# Patient Record
Sex: Female | Born: 1980 | Race: Black or African American | Hispanic: No | Marital: Married | Smoking: Never smoker
Health system: Southern US, Community
[De-identification: ages and names within clinical notes are randomized; demographics above are authoritative.]

## PROBLEM LIST (undated history)

## (undated) DIAGNOSIS — N926 Irregular menstruation, unspecified: Secondary | ICD-10-CM

## (undated) DIAGNOSIS — S83289A Other tear of lateral meniscus, current injury, unspecified knee, initial encounter: Secondary | ICD-10-CM

## (undated) DIAGNOSIS — M94261 Chondromalacia, right knee: Secondary | ICD-10-CM

## (undated) DIAGNOSIS — M1711 Unilateral primary osteoarthritis, right knee: Secondary | ICD-10-CM

## (undated) HISTORY — PX: INCISION AND DRAINAGE BREAST ABSCESS: SUR672

---
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
## 2001-05-09 ENCOUNTER — Emergency Department (HOSPITAL_COMMUNITY): Admission: EM | Admit: 2001-05-09 | Discharge: 2001-05-09 | Payer: Self-pay | Admitting: Emergency Medicine

## 2001-10-13 ENCOUNTER — Emergency Department (HOSPITAL_COMMUNITY): Admission: EM | Admit: 2001-10-13 | Discharge: 2001-10-13 | Payer: Self-pay

## 2002-01-27 ENCOUNTER — Emergency Department (HOSPITAL_COMMUNITY): Admission: EM | Admit: 2002-01-27 | Discharge: 2002-01-27 | Payer: Self-pay | Admitting: *Deleted

## 2002-02-08 ENCOUNTER — Encounter: Payer: Self-pay | Admitting: Emergency Medicine

## 2002-02-08 ENCOUNTER — Emergency Department (HOSPITAL_COMMUNITY): Admission: EM | Admit: 2002-02-08 | Discharge: 2002-02-08 | Payer: Self-pay | Admitting: Emergency Medicine

## 2002-03-14 ENCOUNTER — Emergency Department (HOSPITAL_COMMUNITY): Admission: EM | Admit: 2002-03-14 | Discharge: 2002-03-14 | Payer: Self-pay | Admitting: Emergency Medicine

## 2002-03-17 ENCOUNTER — Emergency Department (HOSPITAL_COMMUNITY): Admission: EM | Admit: 2002-03-17 | Discharge: 2002-03-17 | Payer: Self-pay | Admitting: Emergency Medicine

## 2002-07-01 ENCOUNTER — Emergency Department (HOSPITAL_COMMUNITY): Admission: EM | Admit: 2002-07-01 | Discharge: 2002-07-02 | Payer: Self-pay | Admitting: Emergency Medicine

## 2002-11-04 ENCOUNTER — Inpatient Hospital Stay (HOSPITAL_COMMUNITY): Admission: AD | Admit: 2002-11-04 | Discharge: 2002-11-04 | Payer: Self-pay | Admitting: *Deleted

## 2003-01-04 ENCOUNTER — Inpatient Hospital Stay (HOSPITAL_COMMUNITY): Admission: AD | Admit: 2003-01-04 | Discharge: 2003-01-04 | Payer: Self-pay | Admitting: *Deleted

## 2003-01-19 ENCOUNTER — Inpatient Hospital Stay (HOSPITAL_COMMUNITY): Admission: AD | Admit: 2003-01-19 | Discharge: 2003-01-19 | Payer: Self-pay | Admitting: *Deleted

## 2003-01-19 ENCOUNTER — Encounter: Payer: Self-pay | Admitting: *Deleted

## 2003-01-28 ENCOUNTER — Encounter: Payer: Self-pay | Admitting: Emergency Medicine

## 2003-01-28 ENCOUNTER — Emergency Department (HOSPITAL_COMMUNITY): Admission: EM | Admit: 2003-01-28 | Discharge: 2003-01-28 | Payer: Self-pay | Admitting: Emergency Medicine

## 2003-02-02 ENCOUNTER — Emergency Department (HOSPITAL_COMMUNITY): Admission: EM | Admit: 2003-02-02 | Discharge: 2003-02-02 | Payer: Self-pay | Admitting: Emergency Medicine

## 2004-04-24 ENCOUNTER — Inpatient Hospital Stay (HOSPITAL_COMMUNITY): Admission: AD | Admit: 2004-04-24 | Discharge: 2004-04-24 | Payer: Self-pay | Admitting: Obstetrics and Gynecology

## 2004-07-22 ENCOUNTER — Emergency Department (HOSPITAL_COMMUNITY): Admission: EM | Admit: 2004-07-22 | Discharge: 2004-07-22 | Payer: Self-pay | Admitting: Emergency Medicine

## 2004-08-13 ENCOUNTER — Inpatient Hospital Stay (HOSPITAL_COMMUNITY): Admission: AD | Admit: 2004-08-13 | Discharge: 2004-08-13 | Payer: Self-pay | Admitting: *Deleted

## 2004-09-08 ENCOUNTER — Emergency Department (HOSPITAL_COMMUNITY): Admission: EM | Admit: 2004-09-08 | Discharge: 2004-09-09 | Payer: Self-pay | Admitting: Emergency Medicine

## 2004-10-23 ENCOUNTER — Emergency Department (HOSPITAL_COMMUNITY): Admission: EM | Admit: 2004-10-23 | Discharge: 2004-10-23 | Payer: Self-pay | Admitting: Emergency Medicine

## 2004-11-02 ENCOUNTER — Inpatient Hospital Stay (HOSPITAL_COMMUNITY): Admission: AD | Admit: 2004-11-02 | Discharge: 2004-11-03 | Payer: Self-pay | Admitting: *Deleted

## 2004-12-18 ENCOUNTER — Emergency Department (HOSPITAL_COMMUNITY): Admission: EM | Admit: 2004-12-18 | Discharge: 2004-12-18 | Payer: Self-pay | Admitting: Emergency Medicine

## 2005-02-28 ENCOUNTER — Emergency Department (HOSPITAL_COMMUNITY): Admission: EM | Admit: 2005-02-28 | Discharge: 2005-02-28 | Payer: Self-pay | Admitting: Emergency Medicine

## 2005-04-12 ENCOUNTER — Inpatient Hospital Stay (HOSPITAL_COMMUNITY): Admission: AD | Admit: 2005-04-12 | Discharge: 2005-04-12 | Payer: Self-pay | Admitting: Obstetrics & Gynecology

## 2005-05-01 ENCOUNTER — Emergency Department (HOSPITAL_COMMUNITY): Admission: EM | Admit: 2005-05-01 | Discharge: 2005-05-02 | Payer: Self-pay | Admitting: *Deleted

## 2005-06-04 ENCOUNTER — Inpatient Hospital Stay (HOSPITAL_COMMUNITY): Admission: AD | Admit: 2005-06-04 | Discharge: 2005-06-04 | Payer: Self-pay | Admitting: Obstetrics and Gynecology

## 2006-01-12 ENCOUNTER — Inpatient Hospital Stay (HOSPITAL_COMMUNITY): Admission: AD | Admit: 2006-01-12 | Discharge: 2006-01-13 | Payer: Self-pay | Admitting: *Deleted

## 2006-01-13 ENCOUNTER — Inpatient Hospital Stay (HOSPITAL_COMMUNITY): Admission: AD | Admit: 2006-01-13 | Discharge: 2006-01-13 | Payer: Self-pay | Admitting: Family Medicine

## 2006-01-15 ENCOUNTER — Inpatient Hospital Stay (HOSPITAL_COMMUNITY): Admission: AD | Admit: 2006-01-15 | Discharge: 2006-01-15 | Payer: Self-pay | Admitting: Family Medicine

## 2006-01-18 ENCOUNTER — Inpatient Hospital Stay (HOSPITAL_COMMUNITY): Admission: AD | Admit: 2006-01-18 | Discharge: 2006-01-19 | Payer: Self-pay | Admitting: Obstetrics and Gynecology

## 2006-01-24 ENCOUNTER — Inpatient Hospital Stay (HOSPITAL_COMMUNITY): Admission: AD | Admit: 2006-01-24 | Discharge: 2006-01-25 | Payer: Self-pay | Admitting: Obstetrics and Gynecology

## 2006-02-10 ENCOUNTER — Emergency Department (HOSPITAL_COMMUNITY): Admission: EM | Admit: 2006-02-10 | Discharge: 2006-02-10 | Payer: Self-pay | Admitting: Emergency Medicine

## 2006-10-26 ENCOUNTER — Emergency Department (HOSPITAL_COMMUNITY): Admission: EM | Admit: 2006-10-26 | Discharge: 2006-10-26 | Payer: Self-pay | Admitting: Emergency Medicine

## 2006-11-29 ENCOUNTER — Inpatient Hospital Stay (HOSPITAL_COMMUNITY): Admission: AD | Admit: 2006-11-29 | Discharge: 2006-11-29 | Payer: Self-pay | Admitting: Obstetrics and Gynecology

## 2006-12-02 ENCOUNTER — Inpatient Hospital Stay (HOSPITAL_COMMUNITY): Admission: AD | Admit: 2006-12-02 | Discharge: 2006-12-02 | Payer: Self-pay | Admitting: Obstetrics & Gynecology

## 2007-04-01 ENCOUNTER — Emergency Department (HOSPITAL_COMMUNITY): Admission: EM | Admit: 2007-04-01 | Discharge: 2007-04-01 | Payer: Self-pay | Admitting: Emergency Medicine

## 2007-04-17 ENCOUNTER — Inpatient Hospital Stay (HOSPITAL_COMMUNITY): Admission: AD | Admit: 2007-04-17 | Discharge: 2007-04-17 | Payer: Self-pay | Admitting: Obstetrics and Gynecology

## 2007-04-21 ENCOUNTER — Inpatient Hospital Stay (HOSPITAL_COMMUNITY): Admission: AD | Admit: 2007-04-21 | Discharge: 2007-04-21 | Payer: Self-pay | Admitting: Obstetrics and Gynecology

## 2007-04-30 ENCOUNTER — Inpatient Hospital Stay (HOSPITAL_COMMUNITY): Admission: AD | Admit: 2007-04-30 | Discharge: 2007-05-06 | Payer: Self-pay | Admitting: Obstetrics & Gynecology

## 2007-05-03 ENCOUNTER — Ambulatory Visit: Payer: Self-pay | Admitting: Surgery

## 2008-02-14 ENCOUNTER — Inpatient Hospital Stay (HOSPITAL_COMMUNITY): Admission: AD | Admit: 2008-02-14 | Discharge: 2008-02-14 | Payer: Self-pay | Admitting: Gynecology

## 2008-02-21 ENCOUNTER — Emergency Department (HOSPITAL_COMMUNITY): Admission: EM | Admit: 2008-02-21 | Discharge: 2008-02-21 | Payer: Self-pay | Admitting: Emergency Medicine

## 2008-06-10 ENCOUNTER — Inpatient Hospital Stay (HOSPITAL_COMMUNITY): Admission: AD | Admit: 2008-06-10 | Discharge: 2008-06-10 | Payer: Self-pay | Admitting: Family Medicine

## 2009-01-01 IMAGING — CR DG CHEST 2V
2 series · 2 of 2 positions shown · non-contrast
Comparison: None.

CLINICAL DATA: Chest pain. Shortness of breath.

CHEST - 2 VIEW  04/01/2007:

[w chest pa]
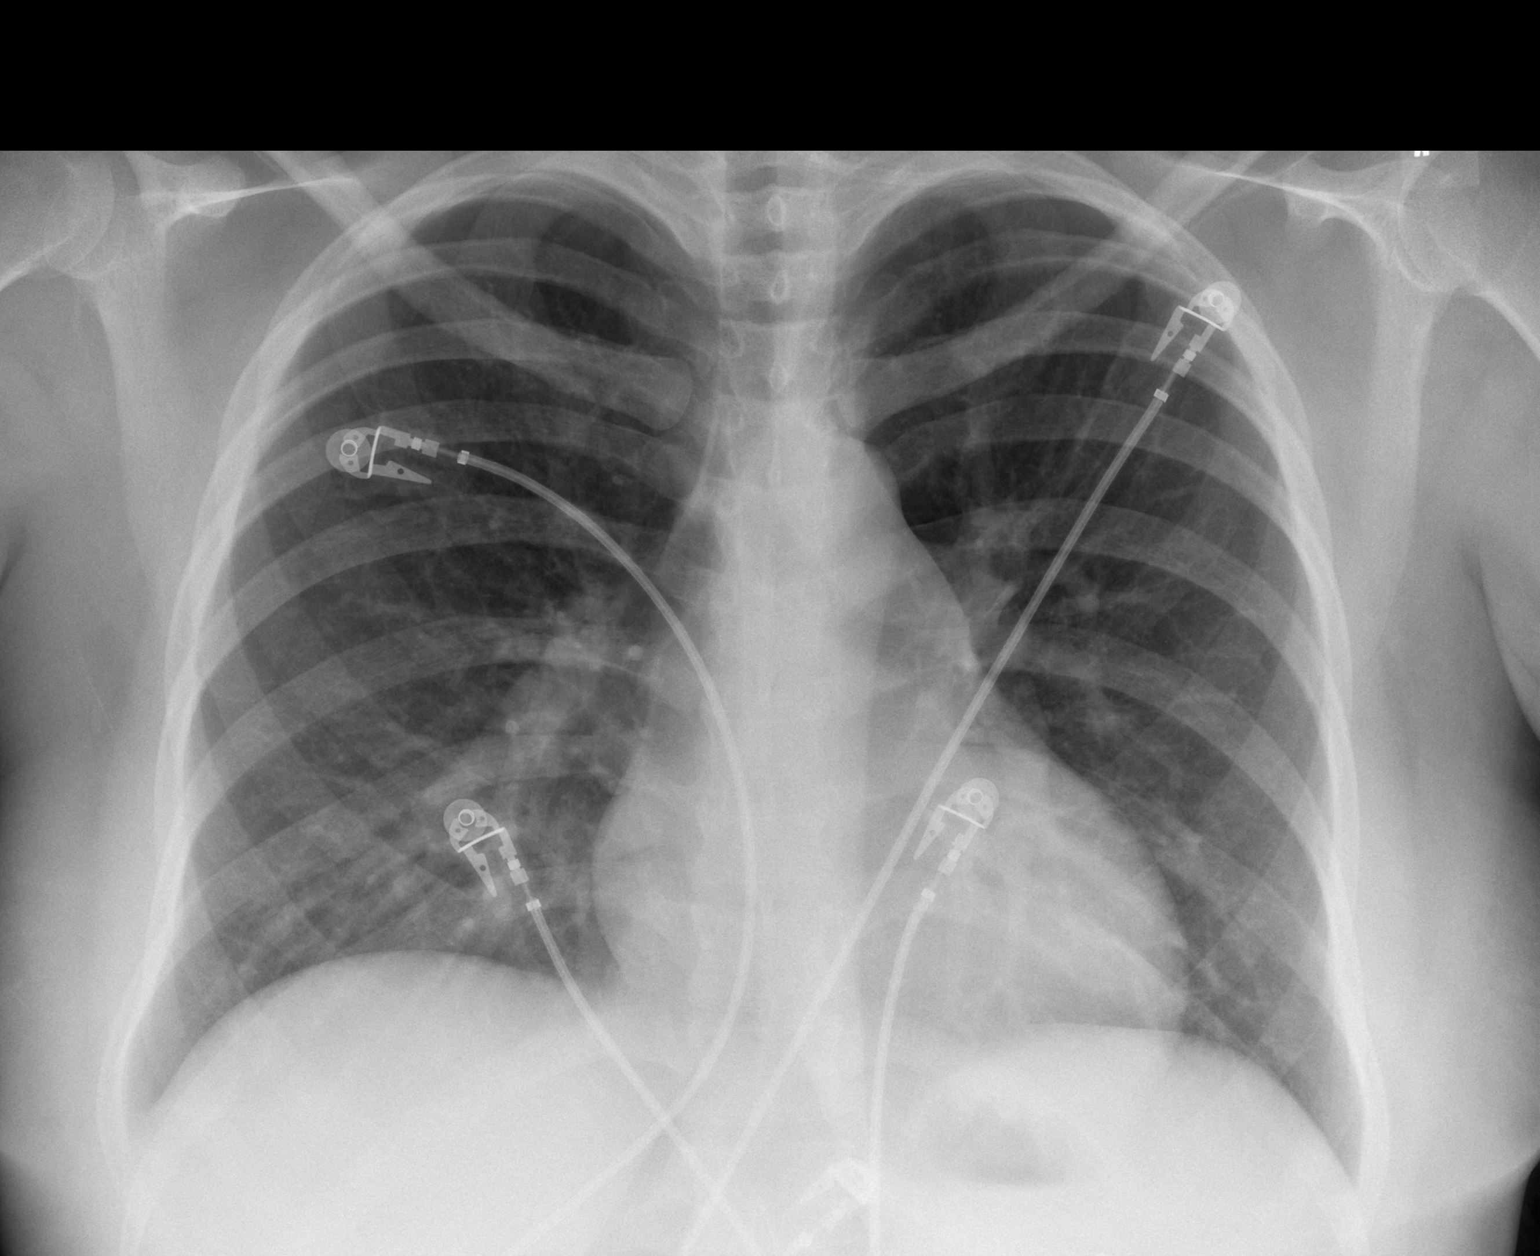

[w chest lat]
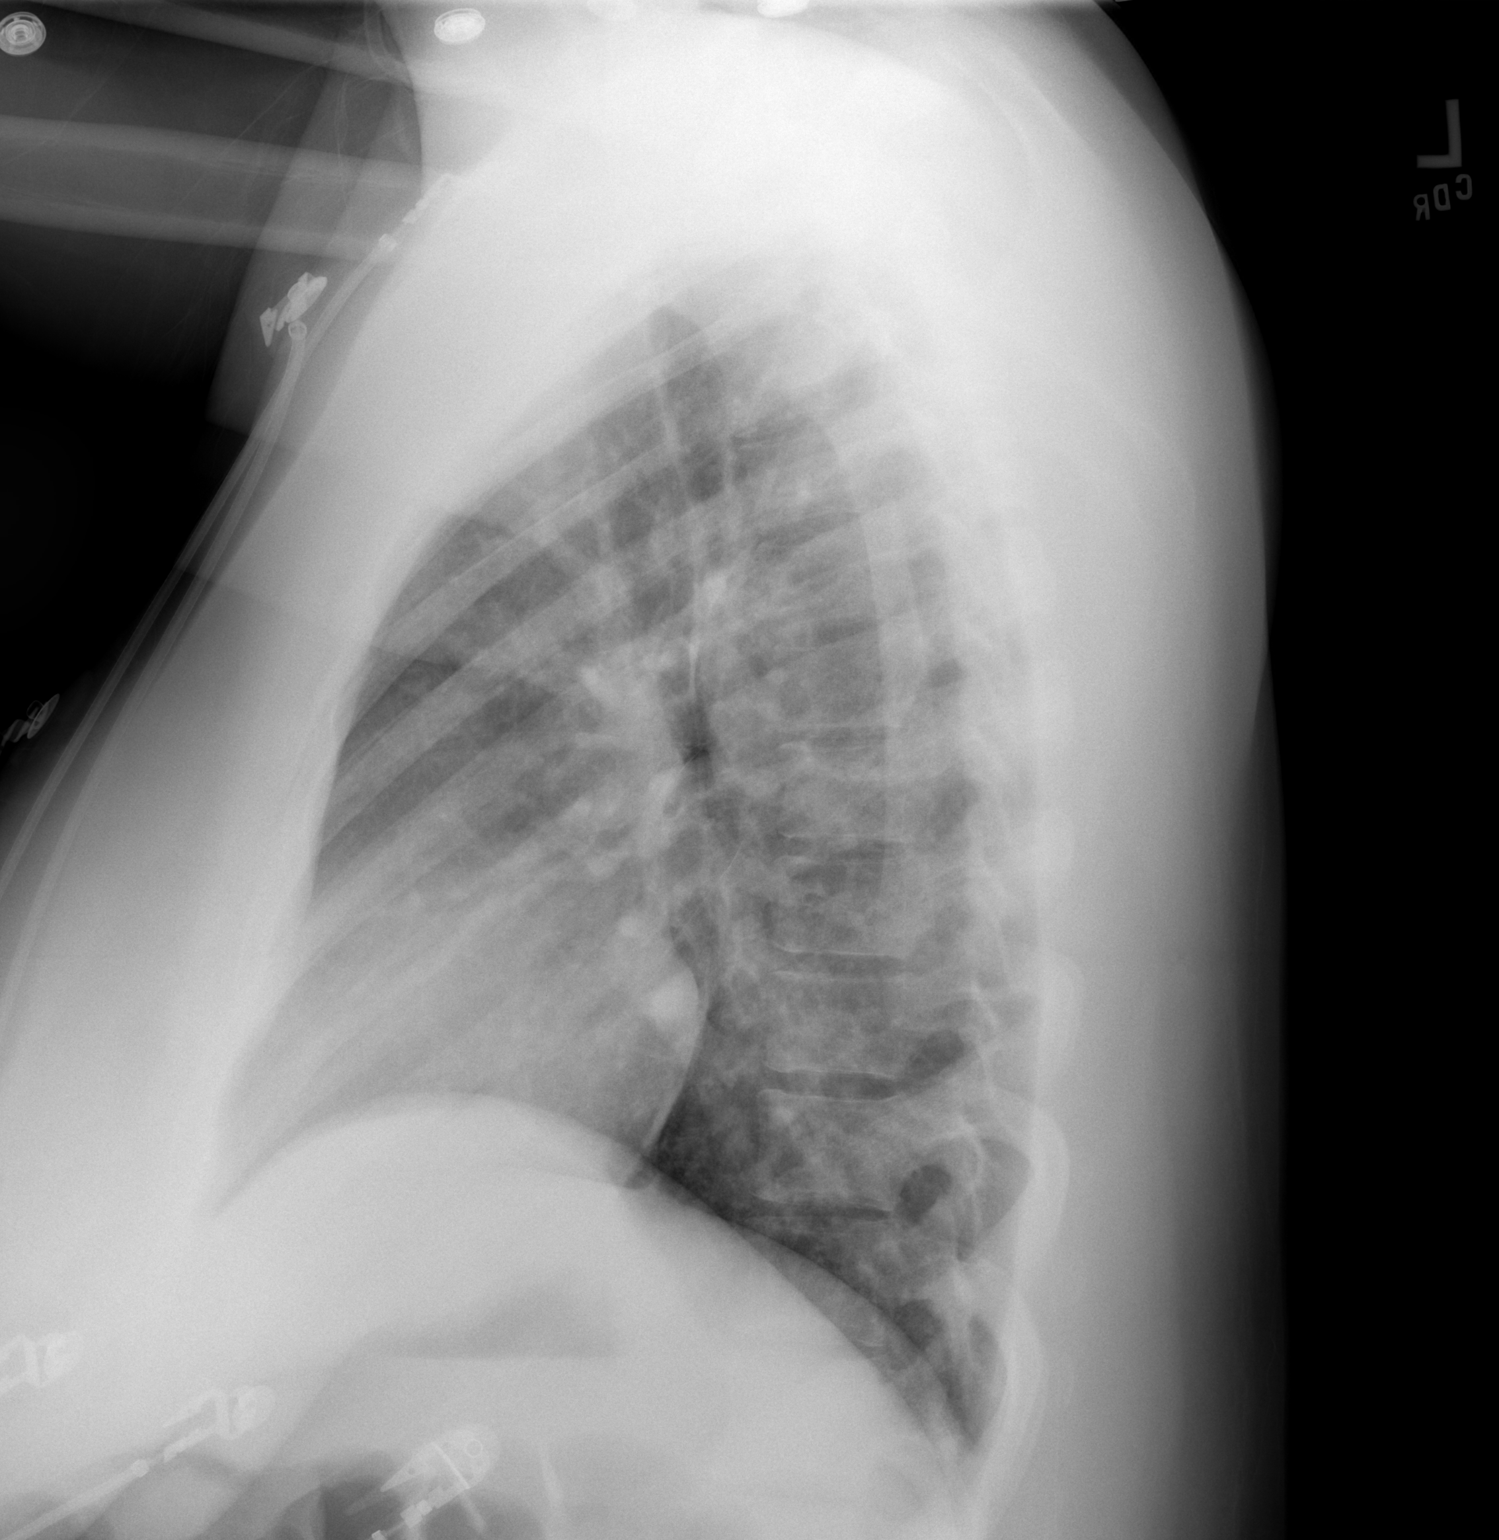

[2 of 2 positions shown; findings below may reference images not displayed]

FINDINGS: Cardiomediastinal silhouette unremarkable. Inspiration less than
optimal on the frontal projection accounting for the crowded bronchovascular
markings in the bases. Inspiration improved on the lateral projection and the
lungs are clear. No pleural effusions. Visualized bony thorax intact.
IMPRESSION: No acute cardiopulmonary disease.

## 2009-02-06 ENCOUNTER — Inpatient Hospital Stay (HOSPITAL_COMMUNITY): Admission: AD | Admit: 2009-02-06 | Discharge: 2009-02-06 | Payer: Self-pay | Admitting: Obstetrics & Gynecology

## 2009-02-08 ENCOUNTER — Ambulatory Visit: Payer: Self-pay | Admitting: Obstetrics and Gynecology

## 2009-02-08 ENCOUNTER — Inpatient Hospital Stay (HOSPITAL_COMMUNITY): Admission: AD | Admit: 2009-02-08 | Discharge: 2009-02-08 | Payer: Self-pay | Admitting: Obstetrics and Gynecology

## 2009-03-22 ENCOUNTER — Emergency Department (HOSPITAL_COMMUNITY): Admission: EM | Admit: 2009-03-22 | Discharge: 2009-03-22 | Payer: Self-pay | Admitting: Emergency Medicine

## 2009-09-08 HISTORY — PX: BREAST EXCISIONAL BIOPSY: SUR124

## 2009-11-20 ENCOUNTER — Inpatient Hospital Stay (HOSPITAL_COMMUNITY): Admission: AD | Admit: 2009-11-20 | Discharge: 2009-11-20 | Payer: Self-pay | Admitting: *Deleted

## 2009-12-20 ENCOUNTER — Emergency Department (HOSPITAL_COMMUNITY): Admission: EM | Admit: 2009-12-20 | Discharge: 2009-12-20 | Payer: Self-pay | Admitting: Emergency Medicine

## 2010-01-28 ENCOUNTER — Emergency Department (HOSPITAL_COMMUNITY): Admission: EM | Admit: 2010-01-28 | Discharge: 2010-01-29 | Payer: Self-pay | Admitting: Emergency Medicine

## 2010-11-25 LAB — POCT I-STAT, CHEM 8
BUN: 10 mg/dL (ref 6–23)
Calcium, Ion: 1.05 mmol/L — ABNORMAL LOW (ref 1.12–1.32)
Chloride: 108 mEq/L (ref 96–112)
Creatinine, Ser: 0.9 mg/dL (ref 0.4–1.2)
Glucose, Bld: 87 mg/dL (ref 70–99)
HCT: 39 % (ref 36.0–46.0)
Hemoglobin: 13.3 g/dL (ref 12.0–15.0)
Potassium: 4 mEq/L (ref 3.5–5.1)
Sodium: 137 mEq/L (ref 135–145)
TCO2: 21 mmol/L (ref 0–100)

## 2010-11-25 LAB — URINALYSIS, ROUTINE W REFLEX MICROSCOPIC
Bilirubin Urine: NEGATIVE
Glucose, UA: NEGATIVE mg/dL
Hgb urine dipstick: NEGATIVE
Ketones, ur: NEGATIVE mg/dL
Nitrite: NEGATIVE
Protein, ur: NEGATIVE mg/dL
Specific Gravity, Urine: 1.025 (ref 1.005–1.030)
Urobilinogen, UA: 0.2 mg/dL (ref 0.0–1.0)
pH: 5.5 (ref 5.0–8.0)

## 2010-12-16 LAB — URINALYSIS, ROUTINE W REFLEX MICROSCOPIC
Bilirubin Urine: NEGATIVE
Glucose, UA: NEGATIVE mg/dL
Ketones, ur: NEGATIVE mg/dL
Leukocytes, UA: NEGATIVE
Nitrite: NEGATIVE
Protein, ur: NEGATIVE mg/dL
Specific Gravity, Urine: 1.02 (ref 1.005–1.030)
Urobilinogen, UA: 0.2 mg/dL (ref 0.0–1.0)
pH: 5.5 (ref 5.0–8.0)

## 2010-12-16 LAB — CBC
HCT: 36.8 % (ref 36.0–46.0)
Hemoglobin: 12.6 g/dL (ref 12.0–15.0)
RBC: 4.11 MIL/uL (ref 3.87–5.11)

## 2010-12-16 LAB — URINE MICROSCOPIC-ADD ON

## 2010-12-16 LAB — GC/CHLAMYDIA PROBE AMP, GENITAL
Chlamydia, DNA Probe: NEGATIVE
GC Probe Amp, Genital: POSITIVE — AB

## 2011-01-15 ENCOUNTER — Inpatient Hospital Stay (INDEPENDENT_AMBULATORY_CARE_PROVIDER_SITE_OTHER)
Admission: RE | Admit: 2011-01-15 | Discharge: 2011-01-15 | Disposition: A | Discharge status: Home / Self Care | Payer: PRIVATE HEALTH INSURANCE | Source: Ambulatory Visit | Attending: Family Medicine | Admitting: Family Medicine

## 2011-01-15 DIAGNOSIS — N61 Mastitis without abscess: Secondary | ICD-10-CM

## 2011-01-21 NOTE — Op Note (Signed)
NAME:  Susan Hamilton, Susan Hamilton NO.:  [id number]   MEDICAL RECORD NO.:  [id number]          PATIENT TYPE:  INP   LOCATION:  9114                          FACILITY:  [facility]   PHYSICIAN:  Gerrit Friends. Aldona Bar, M.D.   DATE OF BIRTH:  [birth year]   DATE OF PROCEDURE:  05/01/2007  DATE OF DISCHARGE:                               OPERATIVE REPORT   PREOPERATIVE DIAGNOSIS:  38-1/2 week intrauterine pregnancy with  maternal requested cesarean section.   POSTOPERATIVE DIAGNOSIS:  38-1/2 week intrauterine pregnancy with  maternal requested cesarean section, delivery of 5 pounds 5 ounces  female infant, Apgars 08/09.   PROCEDURE:  Primary low transverse cesarean section.   SURGEON:  Gerrit Friends. Aldona Bar, M.D.   ANESTHESIA:  Spinal.   HISTORY:  This 30 year old gravida 2, para 0 was admitted on the evening  of 08/22, having been brought to maternity admissions by ambulance  because of a questionable fall at home shortly prior to arrival in  maternity admissions unit.  The patient was experiencing some left leg  pain and lower abdominal pressure on arrival.  Her blood pressure was in  the 130s/80s to 90s and there was some 1 to 2+ edema of the lower  extremities but otherwise her evaluation was negative for any signs of  preeclampsia and fetal heart rate was reactive.  Her cervix was  posterior closed and the vertex was high.  Both the patient and her  mother were extremely anxious and very desirous of admission and  delivery.  Induction was offered but after explaining induction to the  patient in the mother they opted for a primary low transverse cesarean  section which is now being carried out.  The patient was admitted for  overnight observation.  She was monitored and on the morning of 08/23  shortly prior to the scheduled time for cesarean section the procedure  was again reviewed with the patient in the presence of the nurse and her  mother.  Again the patient expresses proper strong  desire for a maternal  requested cesarean section for delivery.  She understands the increased  maternal risk for a cesarean section as opposed to a vaginal delivery  and essentially gave informed consent for this procedure.   The patient was taken to the operating room where after satisfactory  induction of spinal anesthetic she was placed in supine position  slightly tilted left.  She was prepped and draped in usual fashion with  a Foley catheter inserted.  After she was appropriately draped and good  anesthetic levels were documented procedure was begun.   A Pfannenstiel incision was made with minimal difficulty dissected down  sharply to and through the fascia low transverse fashion with hemostasis  created each layer.  Subfascial space was created inferiorly and  superiorly, peritoneum identified and entered appropriately with care  taken to avoid the bowel superiorly and bladder inferiorly.  The bladder  was noted to be somewhat high.   Once the peritoneum was opened, the bladder blade was placed and the  vesicouterine peritoneum was incised in low transverse fashion and  pushed off the  lower uterine segment with ease.  Sharp incision into the  uterus in low transverse fashion was carried out and extended laterally  with fingers.  Thereafter amniotomy produced with production of clear  fluid and thereafter delivery of viable female infant was carried out  from vertex position.  There was a body cord times one.  Infant cried  spontaneously at once and after cord was clamped and cut the infant was  passed off to the waiting team.  Infant was ultimately taken to nursery  in good condition.  Weight wa found to be 5 pounds 5 ounces and Apgars  were 08/09.   As the patient was a cord blood donor, placenta was delivered intact and  passed off to the waiting cord blood personnel.  Cord bloods were be  collected and sent by them.   At this time the uterus was exteriorized.  There was a  small seedling  myoma on the anterior surface of the uterus but otherwise the uterus was  essentially normal as were the tubes and ovaries.  After the uterus  rendered free of any remaining products conception and good uterine  contractility was afforded with slowly given intravenous Pitocin and  manual stimulation.  Closure of the uterine incision was carried out  using a single layer of #1 Vicryl in a running locking fashion and this  was oversewn with several figure-of-eight #1 Vicryls for additional  hemostasis.   At this time the uterine incision dry.  The vesicouterine peritoneum was  reapproximated using running 3-0 Vicryl.  Thereafter, after the abdomen  lavaged of all free blood and clot, the uterus was replaced into the  abdominal cavity and after all counts noted to be correct and no foreign  bodies noted to be remaining in the abdominal cavity closure of the  abdomen was carried out in layers.  The abdominal peritoneum was closed  with 0 Vicryl running fashion.  Muscle secured with same.  Assured of  good fascial hemostasis the fascia was then reapproximated using 0  Vicryl from angle to midline bilaterally.  Subcutaneous tissues were  hemostatic and staples were used to close skin.  Sterile pressure  dressing was applied.  Sterile pressure dressing was applied and at this  time the patient was transport to recovery in satisfactory having  tolerated the procedure well.  Estimated blood loss 500 mL.  All counts  correct x2.  At the conclusion of procedure, both mother and baby were  doing well in their respective recovery areas.   In summary, this patient requested a cesarean section for delivery after  being present the option of induction.  Informed consent was given and  procedure was carried out according to the patient's expectations.      Gerrit Friends. Aldona Bar, M.D.  Electronically Signed     RMW/MEDQ  D:  05/01/2007  T:  05/02/2007  Job:  [id number]

## 2011-01-24 NOTE — Discharge Summary (Signed)
NAME:  Susan Hamilton, Susan Hamilton NO.:  [id number]   MEDICAL RECORD NO.:  [id number]          PATIENT TYPE:  INP   LOCATION:  9374                          FACILITY:  [facility]   PHYSICIAN:  Randye Lobo, M.D.   DATE OF BIRTH:  February 19, 1981   DATE OF ADMISSION:  04/30/2007  DATE OF DISCHARGE:  05/06/2007                               DISCHARGE SUMMARY   FINAL DIAGNOSES:  Intrauterine pregnancy at 38-1/2 weeks' gestation.  Maternal request of a cesarean section.  Declines vaginal delivery.  Postpartum pre-eclampsia.   PROCEDURE:  Primary low transverse cesarean section.  Surgeon Dr. Annamaria Helling.  Complications none.   This 30 year old, G2, P0-0-1-0 presents to the [facility] on the  evening of August 22.  She was brought to maternity admissions by  ambulance because of a questionable fall at home.  The patient was  experiencing left leg pain and lower abdominal pain and pressure on  arrival.  Blood pressure was stable.  She did have some lower extremity  edema.  The fetal heart rate was reactive.  Cervix was still closed and  posterior.  The patient was very anxious.  Dr. Aldona Bar held a discussion  with the patient and her mother about induction, but they both opted for  a primary cesarean section.  The patient's antepartum course up to this  point otherwise had been uncomplicated.  She otherwise had a negative  group B strep culture obtained in our office around 35 weeks.  The  patient was admitted for overnight observation and on the morning of the  23rd was taken for her cesarean section which she still desired.  The  patient was taken to the operating room on May 01, 2007 by Dr. Annamaria Helling where a primary low transverse cesarean section was performed with  the delivery of a 5-pound 5-ounces female infant with Apgars of 8 and 9.  Delivery went without complications.  The patient's postoperative course  was without any significant fevers.  The patient did still continue  to  have some left lower extremity pain but negative Homan's sign.  She did  have a Doppler performed which came back negative for deep venous  thrombosis.  The patient was felt ready for discharge on postoperative  day #3 but then started to complain of some headaches and spots and  visual changes.  The patient started to have some abnormal liver  function tests.  At this point, a decision was, of course, made to keep  the patient in the hospital and start 24 hours of postpartum mag sulfate  for seizure prophylaxis.  She was transferred to the ICU on  postoperative day #3 for this.  She was started on labetalol 300 mg  every 8 hours for her blood pressure.  There is a long note in the  patient's chart from nursing about taking her medications; she would not  take her medications.  By postoperative day #5, the patient was desiring  discharge.  Her blood pressures were stable.  She was having good urine  output, and her liver function tests were normalizing.  The  magnesium  sulfate was discontinued.  She was observed until later in the day and  felt ready for discharge.  She was sent home on her vitamins, was given  Vicodin 1 to 2 every 4 to 6 hours as needed for the pain, told she could  also use ibuprofen up to 600 mg every 6 hours as needed for her pain.  Instructions and precautions were reviewed with the patient.  She was to  follow up in our office in 5-6 days for blood pressure recheck.   The patient's final labs on discharge:  A hemoglobin of 11.9, Cerreta  blood cell count of 9.2, platelets of 220,000 and normalizing liver  function tests on discharge.  She had an AST at 55 and an ALT of 90,  uric acid of 7.4.      Leilani Able, P.A.-C.      Randye Lobo, M.D.  Electronically Signed    MB/MEDQ  D:  06/16/2007  T:  06/17/2007  Job:  [id number]

## 2011-04-18 ENCOUNTER — Inpatient Hospital Stay (INDEPENDENT_AMBULATORY_CARE_PROVIDER_SITE_OTHER)
Admission: RE | Admit: 2011-04-18 | Discharge: 2011-04-18 | Disposition: A | Discharge status: Home / Self Care | Payer: Medicaid Other | Source: Ambulatory Visit | Attending: Family Medicine | Admitting: Family Medicine

## 2011-04-18 DIAGNOSIS — N61 Mastitis without abscess: Secondary | ICD-10-CM

## 2011-04-22 LAB — CULTURE, ROUTINE-ABSCESS

## 2011-05-30 ENCOUNTER — Inpatient Hospital Stay (INDEPENDENT_AMBULATORY_CARE_PROVIDER_SITE_OTHER)
Admission: RE | Admit: 2011-05-30 | Discharge: 2011-05-30 | Disposition: A | Discharge status: Home / Self Care | Payer: Medicaid Other | Source: Ambulatory Visit | Attending: Emergency Medicine | Admitting: Emergency Medicine

## 2011-05-30 DIAGNOSIS — F410 Panic disorder [episodic paroxysmal anxiety] without agoraphobia: Secondary | ICD-10-CM

## 2011-06-05 LAB — URINALYSIS, ROUTINE W REFLEX MICROSCOPIC
Bilirubin Urine: NEGATIVE
Glucose, UA: NEGATIVE
Ketones, ur: NEGATIVE
Protein, ur: NEGATIVE
Urobilinogen, UA: 0.2

## 2011-06-05 LAB — CBC
HCT: 36.2
Hemoglobin: 12.7
MCV: 87.3
RBC: 4.14
WBC: 8.4

## 2011-06-05 LAB — POCT PREGNANCY, URINE: Preg Test, Ur: NEGATIVE

## 2011-06-05 LAB — URINE MICROSCOPIC-ADD ON

## 2011-06-09 LAB — CBC
HCT: 39.9
MCV: 89.6
Platelets: 227
RDW: 13.1

## 2011-06-09 LAB — URINALYSIS, ROUTINE W REFLEX MICROSCOPIC
Glucose, UA: NEGATIVE
Hgb urine dipstick: NEGATIVE
Ketones, ur: 15 — AB
pH: 6

## 2011-06-09 LAB — WET PREP, GENITAL
Clue Cells Wet Prep HPF POC: NONE SEEN
Trich, Wet Prep: NONE SEEN

## 2011-06-09 LAB — GC/CHLAMYDIA PROBE AMP, GENITAL: Chlamydia, DNA Probe: NEGATIVE

## 2011-06-20 LAB — COMPREHENSIVE METABOLIC PANEL
ALT: 107 — ABNORMAL HIGH
ALT: 23
ALT: 68 — ABNORMAL HIGH
ALT: 90 — ABNORMAL HIGH
AST: 23
AST: 55 — ABNORMAL HIGH
AST: 73 — ABNORMAL HIGH
Albumin: 2.1 — ABNORMAL LOW
Albumin: 2.4 — ABNORMAL LOW
Albumin: 2.5 — ABNORMAL LOW
Alkaline Phosphatase: 111
Alkaline Phosphatase: 166 — ABNORMAL HIGH
BUN: 3 — ABNORMAL LOW
BUN: 6
CO2: 25
CO2: 25
Calcium: 8.1 — ABNORMAL LOW
Calcium: 8.4
Calcium: 8.4
Calcium: 8.8
Creatinine, Ser: 0.7
GFR calc Af Amer: >60
GFR calc Af Amer: >60
GFR calc Af Amer: >60
GFR calc non Af Amer: >60
GFR calc non Af Amer: >60
GFR calc non Af Amer: >60
Glucose, Bld: 77
Glucose, Bld: 79
Potassium: 3.8
Sodium: 136
Sodium: 137
Sodium: 138
Sodium: 140
Total Bilirubin: 0.5
Total Protein: 4.9 — ABNORMAL LOW
Total Protein: 5.5 — ABNORMAL LOW
Total Protein: 5.9 — ABNORMAL LOW
Total Protein: 6.1

## 2011-06-20 LAB — URINALYSIS, ROUTINE W REFLEX MICROSCOPIC
Bilirubin Urine: NEGATIVE
Ketones, ur: NEGATIVE
Nitrite: NEGATIVE
Urobilinogen, UA: 0.2

## 2011-06-20 LAB — CBC
HCT: 33.4 — ABNORMAL LOW
HCT: 35.6 — ABNORMAL LOW
Hemoglobin: 11 — ABNORMAL LOW
Hemoglobin: 11.5 — ABNORMAL LOW
Hemoglobin: 12.4
MCHC: 34.5
MCHC: 34.7
MCHC: 35
Platelets: 165
Platelets: 177
RBC: 3.49 — ABNORMAL LOW
RBC: 3.68 — ABNORMAL LOW
RDW: 14
RDW: 14.2 — ABNORMAL HIGH
RDW: 14.2 — ABNORMAL HIGH
RDW: 14.4 — ABNORMAL HIGH
WBC: 10.7 — ABNORMAL HIGH
WBC: 9.9

## 2011-06-20 LAB — DIFFERENTIAL
Basophils Relative: 0
Eosinophils Absolute: 0
Eosinophils Absolute: 0.1
Eosinophils Absolute: 0.1
Eosinophils Absolute: 0.2
Eosinophils Relative: 1
Eosinophils Relative: 1
Eosinophils Relative: 2
Lymphocytes Relative: 28
Lymphs Abs: 2
Lymphs Abs: 2.4
Lymphs Abs: 2.5
Lymphs Abs: 2.8
Monocytes Absolute: 1 — ABNORMAL HIGH
Monocytes Absolute: 1.1 — ABNORMAL HIGH
Monocytes Relative: 11
Monocytes Relative: 11
Monocytes Relative: 12 — ABNORMAL HIGH
Neutro Abs: 5.2
Neutrophils Relative %: 60
Neutrophils Relative %: 63
Neutrophils Relative %: 66

## 2011-06-20 LAB — LACTATE DEHYDROGENASE
LDH: 197
LDH: 210

## 2011-06-20 LAB — MAGNESIUM
Magnesium: 4.1 — ABNORMAL HIGH
Magnesium: 4.9 — ABNORMAL HIGH

## 2011-06-20 LAB — URIC ACID
Uric Acid, Serum: 5.8
Uric Acid, Serum: 7

## 2011-06-20 LAB — CCBB MATERNAL DONOR DRAW

## 2011-06-20 LAB — RPR: RPR Ser Ql: NONREACTIVE

## 2011-06-23 LAB — COMPREHENSIVE METABOLIC PANEL
ALT: 19
AST: 24
Alkaline Phosphatase: 149 — ABNORMAL HIGH
CO2: 24
Chloride: 108
Creatinine, Ser: 0.6
GFR calc Af Amer: >60
GFR calc non Af Amer: >60
Sodium: 136
Total Bilirubin: 0.5

## 2011-06-23 LAB — CBC
MCV: 90.2
RBC: 4.34
WBC: 9.6

## 2011-06-23 LAB — URINALYSIS, ROUTINE W REFLEX MICROSCOPIC
Bilirubin Urine: NEGATIVE
Glucose, UA: NEGATIVE
Hgb urine dipstick: NEGATIVE
Hgb urine dipstick: NEGATIVE
Ketones, ur: NEGATIVE
Protein, ur: NEGATIVE
Specific Gravity, Urine: 1.02
Urobilinogen, UA: 0.2

## 2011-06-23 LAB — I-STAT 8, (EC8 V) (CONVERTED LAB)
Acid-base deficit: 3 — ABNORMAL HIGH
Bicarbonate: 22.1
Hemoglobin: 14.6
Potassium: 3.9
Sodium: 136
TCO2: 23

## 2011-06-23 LAB — POCT CARDIAC MARKERS
CKMB, poc: 1.1
Troponin i, poc: <0.05

## 2012-06-26 ENCOUNTER — Emergency Department (INDEPENDENT_AMBULATORY_CARE_PROVIDER_SITE_OTHER)
Admission: EM | Admit: 2012-06-26 | Discharge: 2012-06-26 | Disposition: A | Discharge status: Home / Self Care | Payer: Medicaid Other | Source: Home / Self Care | Attending: Family Medicine | Admitting: Family Medicine

## 2012-06-26 ENCOUNTER — Encounter (HOSPITAL_COMMUNITY): Payer: Self-pay | Admitting: Emergency Medicine

## 2012-06-26 DIAGNOSIS — W57XXXA Bitten or stung by nonvenomous insect and other nonvenomous arthropods, initial encounter: Secondary | ICD-10-CM

## 2012-06-26 DIAGNOSIS — T148 Other injury of unspecified body region: Secondary | ICD-10-CM

## 2012-06-26 MED ORDER — HYDROXYZINE HCL 25 MG PO TABS: 25.0000 mg | ORAL_TABLET | Freq: Four times a day (QID) | ORAL | Status: DC

## 2012-06-26 MED ORDER — METHYLPREDNISOLONE ACETATE 40 MG/ML IJ SUSP
80.0000 mg | Freq: Once | INTRAMUSCULAR | Status: AC
  Administered 2012-06-26: 80 mg via INTRAMUSCULAR

## 2012-06-26 MED ORDER — METHYLPREDNISOLONE ACETATE 80 MG/ML IJ SUSP
INTRAMUSCULAR | Status: AC
  Filled 2012-06-26: qty 1

## 2012-06-26 MED ORDER — FLUTICASONE PROPIONATE 0.05 % EX CREA: TOPICAL_CREAM | Freq: Two times a day (BID) | CUTANEOUS | Status: DC

## 2012-06-26 NOTE — ED Provider Notes (Signed)
History     CSN: [id number]  Arrival date & time 06/26/12  1221   First MD Initiated Contact with Patient 06/26/12 1239      Chief Complaint  Patient presents with  . Rash    (Consider location/radiation/quality/duration/timing/severity/associated sxs/prior treatment) Patient is a 31 y.o. female presenting with rash. The history is provided by the patient.  Rash  This is a new problem. The current episode started 2 days ago. The problem has not changed since onset.Associated with: hotel stay, then noticed bumps. There has been no fever. The rash is present on the face, left arm, right arm, right hand and left ankle. The patient is experiencing no pain. Associated symptoms include itching.    History reviewed. No pertinent past medical history.  History reviewed. No pertinent past surgical history.  History reviewed. No pertinent family history.  History  Substance Use Topics  . Smoking status: Never Smoker   . Smokeless tobacco: Not on file  . Alcohol Use: No    OB History    Grav Para Term Preterm Abortions TAB SAB Ect Mult Living                  Review of Systems  Constitutional: Negative.   Skin: Positive for itching and rash.    Allergies  Review of patient's allergies indicates no known allergies.  Home Medications   Current Outpatient Rx  Name Route Sig Dispense Refill  . FLUTICASONE PROPIONATE 0.05 % EX CREA Topical Apply topically 2 (two) times daily. 60 g 0  . HYDROXYZINE HCL 25 MG PO TABS Oral Take 1 tablet (25 mg total) by mouth every 6 (six) hours. For itching 20 tablet 0    BP 128/72  Pulse 78  Temp 98.2 F (36.8 C) (Oral)  Resp 18  SpO2 100%  LMP 06/19/2012  Physical Exam  Nursing note and vitals reviewed. Constitutional: She is oriented to person, place, and time. She appears well-developed and well-nourished.  Neurological: She is alert and oriented to person, place, and time.  Skin: Skin is warm and dry. Rash noted.   Scattered erythematous raised pruritic papular lesions, c/w insect bites.    ED Course  Procedures (including critical care time)  Labs Reviewed - No data to display No results found.   1. Multiple insect bites       MDM          Linna Hoff, MD 06/26/12 1356

## 2012-06-26 NOTE — ED Notes (Signed)
Pt had a recent hotel stay on Thursday and now has rash on face arms, hands, and back.  Some irritation. Symptoms have been present since Thursday and gradually getting worse.

## 2014-07-26 ENCOUNTER — Emergency Department (HOSPITAL_COMMUNITY)
Admission: EM | Admit: 2014-07-26 | Discharge: 2014-07-26 | Disposition: A | Discharge status: Home / Self Care | Payer: 59 | Attending: Emergency Medicine | Admitting: Emergency Medicine

## 2014-07-26 ENCOUNTER — Encounter (HOSPITAL_COMMUNITY): Payer: Self-pay | Admitting: Emergency Medicine

## 2014-07-26 ENCOUNTER — Emergency Department (HOSPITAL_COMMUNITY): Payer: 59

## 2014-07-26 DIAGNOSIS — Z7952 Long term (current) use of systemic steroids: Secondary | ICD-10-CM | POA: Diagnosis not present

## 2014-07-26 DIAGNOSIS — R079 Chest pain, unspecified: Secondary | ICD-10-CM | POA: Diagnosis present

## 2014-07-26 LAB — CBC
HEMATOCRIT: 37.7 % (ref 36.0–46.0)
Hemoglobin: 12.3 g/dL (ref 12.0–15.0)
MCH: 28.5 pg (ref 26.0–34.0)
MCHC: 32.6 g/dL (ref 30.0–36.0)
MCV: 87.3 fL (ref 78.0–100.0)
Platelets: 240 10*3/uL (ref 150–400)
RBC: 4.32 MIL/uL (ref 3.87–5.11)
RDW: 13 % (ref 11.5–15.5)
WBC: 7.3 10*3/uL (ref 4.0–10.5)

## 2014-07-26 LAB — I-STAT TROPONIN, ED
Troponin i, poc: 0.01 ng/mL (ref 0.00–0.08)
Troponin i, poc: 0.01 ng/mL (ref 0.00–0.08)
Troponin i, poc: 0.01 ng/mL (ref 0.00–0.08)

## 2014-07-26 LAB — BASIC METABOLIC PANEL
Anion gap: 8 (ref 5–15)
BUN: 12 mg/dL (ref 6–23)
CO2: 25 mEq/L (ref 19–32)
CREATININE: 0.81 mg/dL (ref 0.50–1.10)
Calcium: 9.2 mg/dL (ref 8.4–10.5)
Chloride: 105 mEq/L (ref 96–112)
Glucose, Bld: 87 mg/dL (ref 70–99)
Potassium: 4.2 mEq/L (ref 3.7–5.3)
Sodium: 138 mEq/L (ref 137–147)

## 2014-07-26 MED ORDER — OXYCODONE-ACETAMINOPHEN 5-325 MG PO TABS
1.0000 | ORAL_TABLET | Freq: Once | ORAL | Status: AC
  Administered 2014-07-26: 1 via ORAL
  Filled 2014-07-26: qty 1

## 2014-07-26 MED ORDER — ASPIRIN 81 MG PO CHEW
324.0000 mg | CHEWABLE_TABLET | Freq: Once | ORAL | Status: AC
  Administered 2014-07-26: 324 mg via ORAL
  Filled 2014-07-26: qty 4

## 2014-07-26 MED ORDER — METHOCARBAMOL 500 MG PO TABS: 500.0000 mg | ORAL_TABLET | Freq: Two times a day (BID) | ORAL | Status: DC

## 2014-07-26 NOTE — ED Notes (Signed)
Pt states that her step father died on Monday and ever since, she has been having intermittent lt sided cp that radiates down lt arm.  Called her PCP and they told her to come here.  Denies NVD, SOB, or lightheadedness.

## 2014-07-26 NOTE — Discharge Instructions (Signed)
For pain control you may take up to 800mg  of Motrin (also known as ibuprofen). That is usually 4 over the counter pills,  3 times a day. Take with food to minimize stomach irritation   You can also take  tylenol (acetaminophen) 975mg  (this is 3 over the counter pills) four times a day. Do not drink alcohol or combine with other medications that have acetaminophen as an ingredient (Read the labels!).    For breakthrough pain you may take Robaxin. Do not drink alcohol, drive or operate heavy machinery when taking Robaxin.  Do not hesitate to return to the emergency room for any new, worsening or concerning symptoms.  Please obtain primary care using resource guide below. But the minute you were seen in the emergency room and that they will need to obtain records for further outpatient management.   Chest Pain (Nonspecific) It is often hard to give a specific diagnosis for the cause of chest pain. There is always a chance that your pain could be related to something serious, such as a heart attack or a blood clot in the lungs. You need to follow up with your health care provider for further evaluation. CAUSES   Heartburn.  Pneumonia or bronchitis.  Anxiety or stress.  Inflammation around your heart (pericarditis) or lung (pleuritis or pleurisy).  A blood clot in the lung.  A collapsed lung (pneumothorax). It can develop suddenly on its own (spontaneous pneumothorax) or from trauma to the chest.  Shingles infection (herpes zoster virus). The chest wall is composed of bones, muscles, and cartilage. Any of these can be the source of the pain.  The bones can be bruised by injury.  The muscles or cartilage can be strained by coughing or overwork.  The cartilage can be affected by inflammation and become sore (costochondritis). DIAGNOSIS  Lab tests or other studies may be needed to find the cause of your pain. Your health care provider may have you take a test called an ambulatory  electrocardiogram (ECG). An ECG records your heartbeat patterns over a 24-hour period. You may also have other tests, such as:  Transthoracic echocardiogram (TTE). During echocardiography, sound waves are used to evaluate how blood flows through your heart.  Transesophageal echocardiogram (TEE).  Cardiac monitoring. This allows your health care provider to monitor your heart rate and rhythm in real time.  Holter monitor. This is a portable device that records your heartbeat and can help diagnose heart arrhythmias. It allows your health care provider to track your heart activity for several days, if needed.  Stress tests by exercise or by giving medicine that makes the heart beat faster. TREATMENT   Treatment depends on what may be causing your chest pain. Treatment may include:  Acid blockers for heartburn.  Anti-inflammatory medicine.  Pain medicine for inflammatory conditions.  Antibiotics if an infection is present.  You may be advised to change lifestyle habits. This includes stopping smoking and avoiding alcohol, caffeine, and chocolate.  You may be advised to keep your head raised (elevated) when sleeping. This reduces the chance of acid going backward from your stomach into your esophagus. Most of the time, nonspecific chest pain will improve within 2-3 days with rest and mild pain medicine.  HOME CARE INSTRUCTIONS   If antibiotics were prescribed, take them as directed. Finish them even if you start to feel better.  For the next few days, avoid physical activities that bring on chest pain. Continue physical activities as directed.  Do not use any  tobacco products, including cigarettes, chewing tobacco, or electronic cigarettes.  Avoid drinking alcohol.  Only take medicine as directed by your health care provider.  Follow your health care provider's suggestions for further testing if your chest pain does not go away.  Keep any follow-up appointments you made. If you do  not go to an appointment, you could develop lasting (chronic) problems with pain. If there is any problem keeping an appointment, call to reschedule. SEEK MEDICAL CARE IF:   Your chest pain does not go away, even after treatment.  You have a rash with blisters on your chest.  You have a fever. SEEK IMMEDIATE MEDICAL CARE IF:   You have increased chest pain or pain that spreads to your arm, neck, jaw, back, or abdomen.  You have shortness of breath.  You have an increasing cough, or you cough up blood.  You have severe back or abdominal pain.  You feel nauseous or vomit.  You have severe weakness.  You faint.  You have chills. This is an emergency. Do not wait to see if the pain will go away. Get medical help at once. Call your local emergency services (911 in U.S.). Do not drive yourself to the hospital. MAKE SURE YOU:   Understand these instructions.  Will watch your condition.  Will get help right away if you are not doing well or get worse. Document Released: 06/04/2005 Document Revised: 08/30/2013 Document Reviewed: 03/30/2008 [facility] Patient Information 2015 [place], [place]. This information is not intended to replace advice given to you by your health care provider. Make sure you discuss any questions you have with your health care provider.   Emergency Department Resource Guide 1) Find a Doctor and Pay Out of Pocket Although you won't have to find out who is covered by your insurance plan, it is a good idea to ask around and get recommendations. You will then need to call the office and see if the doctor you have chosen will accept you as a new patient and what types of options they offer for patients who are self-pay. Some doctors offer discounts or will set up payment plans for their patients who do not have insurance, but you will need to ask so you aren't surprised when you get to your appointment.  2) Contact Your Local Health Department Not all health  departments have doctors that can see patients for sick visits, but many do, so it is worth a call to see if yours does. If you don't know where your local health department is, you can check in your phone book. The CDC also has a tool to help you locate your state's health department, and many state websites also have listings of all of their local health departments.  3) Find a Walk-in Clinic If your illness is not likely to be very severe or complicated, you may want to try a walk in clinic. These are popping up all over the country in pharmacies, drugstores, and shopping centers. They're usually staffed by nurse practitioners or physician assistants that have been trained to treat common illnesses and complaints. They're usually fairly quick and inexpensive. However, if you have serious medical issues or chronic medical problems, these are probably not your best option.  No Primary Care Doctor: - Call Health Connect at  [phone] - they can help you locate a primary care doctor that  accepts your insurance, provides certain services, etc. - Physician Referral Service- [phone]  Chronic Pain Problems: Organization  Address  Phone   Notes  [facility]  [phone] Patients need to be referred by their primary care doctor.   Medication Assistance: Organization         Address  Phone   Notes  [facility] Medication [facility] [address] [place], [place] [address] [phone] --Must be a resident of [facility] -- Must have NO insurance coverage whatsoever (no Medicaid/ Medicare, etc.) -- The pt. MUST have a primary care doctor that directs their care regularly and follows them in the community   MedAssist  [phone]   [employer]  [phone]    Agencies that provide inexpensive medical care: Organization         Address  Phone   Notes  [place]  [phone]   [facility]     [phone]   [facility] [place], [place] [address] [phone]   [place] [address] [place] [phone]   Planned Parenthood    [phone]   [facility]    [phone]   [place] and [place] [address], [place] Phone:  [phone], Fax:  [phone] Hours of Operation:  9 am - 6 pm, M-F.  Also accepts Medicaid/Medicare and self-pay.  [facility] for [address] [place], [address], [place] Phone: [phone], Fax: [phone]. Hours of Operation:  8:30 am - 5:30 pm, M-F.  Also accepts Medicaid and self-pay.  [facility] [place] [address] [place] Phone: [phone]   [place], [place], [place] [phone], Ext. [phone] Mondays & Thursdays: 7-9 AM.  First 15 patients are seen on a first come, first serve basis.    [place] Providers:  Organization         Address  Phone   Notes  [facility] [address], [place] [phone] Also accepts self-pay patients.  [facility] [address], [place]  [phone]   [place], [address], [place] [phone]   [facility] [address], [place] [phone]   Lucianne Lei [address], [place]   [phone] Only accepts [place] Access [place] patients after they have their name applied to their card.   Self-Pay (no insurance) in [facility]:  Organization         Address  Phone   Notes  Sickle Cell Patients, [address] [facility] Internal Medicine [place] [phone]   [facility] Urgent Care [place] [phone]   [facility] [place]  [place], [place], [facility] [phone]     [facility]/Dr. Osei-Bonsu  [address] [place] or [address], [place] [phone] Phone number for both [place] and [place] locations is the same.  [facility]gent [facility] and [facility] [address] [place] [phone]   [facility] [address] [place] or [address] [phone]   [facility] [address] [place] [phone], phone; [phone], fax Sees patients 1st and 3rd Saturday of every month.  Must not  qualify for public or private insurance (i.e. Medicaid, Medicare, [place] Health Choice, Veterans' Benefits)  Household income should be no more than 200% of the poverty level The clinic cannot treat you if you are pregnant or think you are pregnant  Sexually transmitted diseases are not treated at the clinic.    Dental Care: Organization         Address  Phone  Notes  [facility] Department of [facility] [phone] Accepts children up to age 83 who are enrolled in [place] or [place]; pregnant women with a Medicaid card; and children who have applied for Medicaid or [place] Health Choice, but were declined, whose parents can pay a reduced fee at time of service.  [facility] Department of [facility]  [address], [place] [phone] Accepts children up to age 80 who are enrolled in [place] or [place]; pregnant women with a Medicaid card; and children who have applied for Medicaid or [place] Health Choice, but were declined, whose parents can pay a reduced fee at time of service.  [place] Adult Dental Access PROGRAM  [place] [phone] Patients are seen by appointment only. Walk-ins are not accepted. [place] will see patients 75 years of age and older. Monday - Tuesday (8am-5pm) Most Wednesdays (8:30-5pm) $30 per visit,  cash only  [facility] Adult Dental Access PROGRAM  [address], [facility] [phone] Patients are seen by appointment only. Walk-ins are not accepted. [place] will see patients 67 years of age and older. One Wednesday Evening (Monthly: Volunteer Based).  $30 per visit, cash only  [place]  [phone] for adults; Children under age 73, call Graduate Pediatric Dentistry at [phone]. Children aged 13-14, please call [phone] to request a pediatric application.  Dental services are provided in all areas of dental care including fillings, crowns and bridges, complete and partial dentures, implants, gum treatment, root canals, and extractions. Preventive care is also provided. Treatment is provided to both adults and children. Patients are selected via a lottery and there is often a waiting list.   [facility] [address], [place]  [phone] [email or web address]   [facility] [address] [place], [place] [phone], Ext. [phone] Second and Fourth Thursday of each month, opens at 6:30 AM; Clinic ends at 9 AM.  Patients are seen on a first-come first-served basis, and a limited number are seen during each clinic.   [facility]  [address] Hillard Danker [place], [place] [phone]   Eligibility Requirements You must have lived in [place], [place], or [place] counties for at least the last three months.   You cannot be eligible for state or federal sponsored [employer], including [employer], [place], or [employer].   You generally cannot be eligible for healthcare insurance through your employer.    How to apply: Eligibility screenings are held every Tuesday and Wednesday afternoon from 1:00 pm until 4:00 pm. You do not need an appointment for the interview!  [facility] [address], [place], [place]   [place]   [place] Department  [place]  [phone]    Behavioral Health Resources in the Community: Intensive Outpatient Programs Organization         Address  Phone  Notes  [place] [address] [place], [place] [phone]   [facility] [address], [place], [place]   ADS: Alcohol & Drug Svcs [address], [place], [place]   [place] [address],  [place], [place] or [phone]   Substance Abuse Resources Organization         Address  Phone  Notes  Alcohol and Drug Services  [phone]   [place]  [phone]   [facility] [place]   Chinita Pester  [phone]   Residential & Outpatient Substance Abuse Program  [phone]   Psychological Services Organization         Address  Phone  Notes  [facility]  [place]  [phone]   [place] [address] [place] or [phone]    Mobile Crisis Teams Organization         Address  Phone  Notes  [facility], Mobile Crisis Care Unit  [phone]   [facility] Psychotherapeutic Services  [address] [place], [place]   [place] Levels [address], [place] [phone]    Self-Help/Support Groups Organization         Address  Phone             Notes  [place]. of [place] - variety of support groups  [place] Call for more information  Narcotics Anonymous (NA), [facility] [address], [employer] [place]  2 meetings at this location   Special educational needs teacher         Address  Phone  Notes  [facility] Residential Treatment [place],    [place]  [phone]   [facility]  [address], [place] [phone], [place], [place]    [place], [place] [phone] Admissions: 8am-3pm M-F  Incentives Substance [place] [address],    [place], [place] [phone]   [facility] [address] [place], [place], [place]   [facility] [address],  [place], [place]   [facility]ight Programs - Intensive Outpatient [address], [place], [place]   [facility] ([place].) [address].,  [place], [place] [phone] or [phone]   [facility] Services (RTS) [address], [place], [place] Accepts Medicaid  [facility] [place] [address],  [place] [phone] Substance Abuse/Addiction Treatment   [facility] Organization         Address  Phone  Notes  [facility]  [phone]   Domenic Schwab, PhD [address] Arlis [place], [place]   [phone] or [phone]   [facility] [facility] [place], [place] [phone]   [place] [address], [place], [place] [phone] Insurance/Medicaid/sponsorship through [facility] and Families [address], [address] [place]                                    [place], [place] [phone] [place] [address], [place] [phone]    Dr. Adele Schilder  [phone]   [facility]  [place] Dept. 1) [address] [place] 2) [place] 3)  [place] 65, [place] [phone]  [phone]   [place] [phone] or [phone] (After Hours)

## 2014-07-26 NOTE — ED Notes (Signed)
i-stat trop drawn.

## 2014-07-26 NOTE — ED Provider Notes (Signed)
CSN: [id number]     Arrival date & time 07/26/14  1036 History   First MD Initiated Contact with Patient 07/26/14 1057     Chief Complaint  Patient presents with  . Chest Pain     (Consider location/radiation/quality/duration/timing/severity/associated sxs/prior Treatment) HPI   Susan Hamilton is a 33 y.o. female complaining of Left-sided chest pain described as sharp, radiating to left shoulder; pain is intermittent, waxing and waning onset 3 days ago (day of her step-father's death), it is not exacerbated by exertion, palpation, deep breathing. Pain lasts for approximately 5 minutes at a time. Comes less than 4 times an hour. His minimally relieved with acetaminophen. Is 8 and half out of 10 at its worst, 8 right now. Patient denies associated  symptoms of cough, fever, chills, nausea, vomiting, shortness of breath, history of DVT or PE, recent immobilizations, history of smoking, history of diabetes, history of hypertension, family history of early cardiac death, suicidal ideation, homicidal ideation, auditory or visual hallucinations, alcohol or drug abuse.  Cardiac risk factors hyperlipidemia, not currently treated.  History reviewed. No pertinent past medical history. History reviewed. No pertinent past surgical history. History reviewed. No pertinent family history. History  Substance Use Topics  . Smoking status: Never Smoker   . Smokeless tobacco: Not on file  . Alcohol Use: No   OB History    No data available     Review of Systems  10 systems reviewed and found to be negative, except as noted in the HPI.   Allergies  Review of patient's allergies indicates no known allergies.  Home Medications   Prior to Admission medications   Medication Sig Start Date End Date Taking? Authorizing Provider  fluticasone (CUTIVATE) 0.05 % cream Apply topically 2 (two) times daily. 06/26/12   Linna HoffJames D Kindl, MD  hydrOXYzine (ATARAX/VISTARIL) 25 MG tablet Take 1 tablet (25 mg  total) by mouth every 6 (six) hours. For itching 06/26/12   Linna HoffJames D Kindl, MD   BP 125/89 mmHg  Pulse 85  Temp(Src) 97.9 F (36.6 C) (Oral)  Resp 18  SpO2 100%  LMP 06/25/2014 Physical Exam  Constitutional: She is oriented to person, place, and time. She appears well-developed and well-nourished. No distress.  HENT:  Head: Normocephalic.  Mouth/Throat: Oropharynx is clear and moist.  Eyes: Conjunctivae and EOM are normal. Pupils are equal, round, and reactive to light.  Neck: Normal range of motion.  Cardiovascular: Normal rate.   Pulmonary/Chest: Effort normal and breath sounds normal. No stridor. No respiratory distress. She has no wheezes. She has no rales. She exhibits no tenderness.  Abdominal: Soft. Bowel sounds are normal. She exhibits no distension and no mass. There is no tenderness. There is no rebound and no guarding.  Musculoskeletal: Normal range of motion. She exhibits no edema or tenderness.  No calf asymmetry, superficial collaterals, palpable cords, edema, Homans sign negative bilaterally.    Neurological: She is alert and oriented to person, place, and time.  Psychiatric: She has a normal mood and affect.  Nursing note and vitals reviewed.   ED Course  Procedures (including critical care time) Labs Review Labs Reviewed  CBC  BASIC METABOLIC PANEL  I-STAT TROPOININ, ED    Imaging Review No results found.   EKG Interpretation None      MDM   Final diagnoses:  Chest pain    Filed Vitals:   07/26/14 1430 07/26/14 1500 07/26/14 1530 07/26/14 1600  BP: 105/67 107/68 102/68 98/63  Pulse: 67 74 77  73  Temp:      TempSrc:      Resp: 18 21 23 17   SpO2: 99% 99% 100% 100%    Medications  aspirin chewable tablet 324 mg (324 mg Oral Given 07/26/14 1128)  oxyCODONE-acetaminophen (PERCOCET/ROXICET) 5-325 MG per tablet 1 tablet (1 tablet Oral Given 07/26/14 1128)    Susan Hamilton is a 33 y.o. female presenting with Left anterior chest pain  radiating to the left shoulder described as sharp, this has been intermittent over the last 3 days since the passing of her stepfather. Patient is low risk by Wells criteria and PERC negative.patient's only cardiac risk factor is hyperlipidemia. No family history of early cardiac death. She is low risk by heart score. EKG is nonischemic, no anemia on blood work, chest x-ray without abnormality, delta troponin is negative.  Discussed case with attending MD who agrees with plan and stability to d/c to home.    Evaluation does not show pathology that would require ongoing emergent intervention or inpatient treatment. Pt is hemodynamically stable and mentating appropriately. Discussed findings and plan with patient/guardian, who agrees with care plan. All questions answered. Return precautions discussed and outpatient follow up given.   Discharge Medication List as of 07/26/2014  3:46 PM    START taking these medications   Details  methocarbamol (ROBAXIN) 500 MG tablet Take 1 tablet (500 mg total) by mouth 2 (two) times daily., Starting 07/26/2014, Until Discontinued, Print            Wynetta Emeryicole Maveryck Bahri, PA-C 07/26/14 1657  Samuel JesterKathleen McManus, DO 07/28/14 [phone]

## 2014-07-26 NOTE — ED Notes (Signed)
Initial Contact - pt A+Ox4, reports L sided CP x3 days.  Reports onset after death in the family, intermittently worse, radiates into LUE.  Pt denies other complaints.  Neuros grossly intact, skin PWD.  Speaking full/clear sentences, rr even/un-lab.  Placed to cardiac/02 monitor.  NAD.

## 2015-02-09 ENCOUNTER — Ambulatory Visit (INDEPENDENT_AMBULATORY_CARE_PROVIDER_SITE_OTHER): Payer: 59 | Admitting: Certified Nurse Midwife

## 2015-02-09 ENCOUNTER — Encounter: Payer: Self-pay | Admitting: Certified Nurse Midwife

## 2015-02-09 VITALS — BP 100/64 | HR 70 | Resp 20 | Ht 66.5 in | Wt 234.0 lb

## 2015-02-09 DIAGNOSIS — N6452 Nipple discharge: Secondary | ICD-10-CM | POA: Diagnosis not present

## 2015-02-09 DIAGNOSIS — N644 Mastodynia: Secondary | ICD-10-CM | POA: Diagnosis not present

## 2015-02-09 DIAGNOSIS — Z872 Personal history of diseases of the skin and subcutaneous tissue: Secondary | ICD-10-CM | POA: Diagnosis not present

## 2015-02-09 DIAGNOSIS — N912 Amenorrhea, unspecified: Secondary | ICD-10-CM

## 2015-02-09 DIAGNOSIS — Z0000 Encounter for general adult medical examination without abnormal findings: Secondary | ICD-10-CM

## 2015-02-09 LAB — POCT URINALYSIS DIPSTICK
Bilirubin, UA: NEGATIVE
GLUCOSE UA: NEGATIVE
KETONES UA: NEGATIVE
LEUKOCYTES UA: NEGATIVE
NITRITE UA: NEGATIVE
PH UA: 5
PROTEIN UA: NEGATIVE
RBC UA: NEGATIVE
UROBILINOGEN UA: NEGATIVE

## 2015-02-09 LAB — POCT URINE PREGNANCY: PREG TEST UR: NEGATIVE

## 2015-02-09 NOTE — Progress Notes (Signed)
Patient scheduled [facility] bilateral diagnostic [facility] at 7[facility]20am. Patient declines earlier appointment due to schedule.

## 2015-02-09 NOTE — Progress Notes (Signed)
34 y.o. [id number] Single  African American Fe here to establish gyn care and for left breast problem only. Previous history of breast abscess with I & D with surgeon while breast feeding.. Had reocurence in 2014 with another I & D. Patient  has been noting nipple discharge, mucous with yellow/red. Last time seen with palpation was  ? 2 months ago. Does see discharge on shirt or bra sometimes, last time ??. Denies soreness or redness in breast. She has had some sharp pains occasional in breast. Patient will be moving to [place] in one month. Interim care here today. Was seen for breast issue at [facility] after delivery of her baby when I & D was done. Patient getting married in one month and will be traveling out of the country and wants to make sure no problems. Has breast US in past to detect infection. Recent stopping of OCP, with amenorrhea negative UPT. Desires no exam to start back on OCP. No other concerns.  Patient's last menstrual period was 12/04/2014.           Sexually active: Yes.    The current method of family planning is none.    Exercising: Yes.    walking Smoker:  no  Health Maintenance: Pap:  8/15 neg per patient MMG:  2013? Colonoscopy:  none BMD:   none TDaP: ? Labs: Poct urine-neg, UPT-neg Self breast exam: done daily   reports that she has never smoked. She does not have any smokeless tobacco history on file. She reports that she does not drink alcohol or use illicit drugs.  Past Medical History  Diagnosis Date  . Depression   . Anxiety   . Hypertension     after pregnancy only    Past Surgical History  Procedure Laterality Date  . Incision and drainage breast abscess    . Cesarean section      Current Outpatient Prescriptions  Medication Sig Dispense Refill  . Multiple Vitamin (MULTIVITAMIN WITH MINERALS) TABS tablet Take 1 tablet by mouth daily.     No current facility-administered medications for this visit.    History reviewed. No pertinent family  history.  ROS:  Pertinent items are noted in HPI.  Otherwise, a comprehensive ROS was negative.  Exam:   BP 100/64 mmHg  Pulse 70  Resp 20  Ht 5' 6.5" (1.689 m)  Wt 234 lb (106.142 kg)  BMI 37.21 kg/m2  LMP 12/04/2014 Height: 5' 6.5" (168.9 cm) Ht Readings from Last 3 Encounters:  02/09/15 5' 6.5" (1.689 m)    General appearance: alert, cooperative and appears stated age Healthy [facility]DWN female  Breasts: normal appearance, no masses or tenderness, No nipple retraction or dimpling, No nipple discharge or bleeding, No axillary or supraclavicular adenopathy, positive findings: left breast several scars noted from prevous I & D noted, no firm areas noted or masses noted in left breast.No nipple discharge noted or redness..  slight tenderness at 3 o'clock  Chaperone present: Yes  A:  Left breast tenderness, nipple discharge reported by patient  History of I & D of abscess on left breast x 2, concern for reoccurrence  Amenorrhea negative UPT, decline exam other than breast.  P:   Discussed findings of tenderness and with report of nipple discharge feel diagnostic mammogram and [place] warranted. Patient agreeable. Will be scheduled prior to leaving office. Questions addressed at length. Lab Prolactin  Discussed with patient warning signs of abscess and none noted today. Discussed if no menses by first of July needs  evaluation for. Voiced understanding.  Rv prn

## 2015-02-10 LAB — PROLACTIN: PROLACTIN: 4.8 ng/mL

## 2015-02-12 NOTE — Progress Notes (Signed)
Reviewed personally.  M. Suzanne Sheree Lalla, MD.  

## 2015-02-13 ENCOUNTER — Telehealth: Payer: Self-pay

## 2015-02-13 NOTE — Telephone Encounter (Signed)
Left message to call Julita Ozbun at [phone].  Need to advise patient will need to sign release form from the [facility] to [facility] to have previous mammogram results and images sent to the [facility] for appointment on 6/17.

## 2015-02-14 NOTE — Telephone Encounter (Signed)
Spoke with patient. Patient will call [facility] to have release form sent to her so that she can sign and approve images to be sent to the [facility].  Routing to provider for final review. Patient agreeable to disposition. Will close encounter.

## 2015-02-22 ENCOUNTER — Other Ambulatory Visit: Payer: Medicaid Other

## 2015-02-23 ENCOUNTER — Ambulatory Visit
Admission: RE | Admit: 2015-02-23 | Discharge: 2015-02-23 | Disposition: A | Discharge status: Home / Self Care | Payer: 59 | Source: Ambulatory Visit | Attending: Certified Nurse Midwife | Admitting: Certified Nurse Midwife

## 2015-02-23 DIAGNOSIS — N644 Mastodynia: Secondary | ICD-10-CM

## 2015-03-01 ENCOUNTER — Telehealth: Payer: Self-pay | Admitting: Emergency Medicine

## 2015-03-01 NOTE — Telephone Encounter (Signed)
Message left to return call to Susan Hamilton at [phone].    

## 2015-03-01 NOTE — Telephone Encounter (Signed)
-----   Message from Ria Comment, FNP sent at 02/26/2015  8:36 AM EDT ----- Please take out of recall after MD review - but she will need to have a breast recheck with Ms. Debbie in a month. She can also see if still irregular menses.

## 2015-03-07 NOTE — Telephone Encounter (Signed)
Spoke with patient and scheduled follow up appointment with Verner Choleborah S. Leonard CNM for 03/21/15. Patient advised to arrive at 0745 for 0800 appointment. Patient given address and location of office per her request. Routing to provider for final review. Patient agreeable to disposition. Will close encounter.

## 2015-03-18 ENCOUNTER — Ambulatory Visit (INDEPENDENT_AMBULATORY_CARE_PROVIDER_SITE_OTHER): Payer: 59

## 2015-03-18 ENCOUNTER — Ambulatory Visit (INDEPENDENT_AMBULATORY_CARE_PROVIDER_SITE_OTHER): Payer: 59 | Admitting: Family Medicine

## 2015-03-18 VITALS — BP 104/80 | HR 85 | Temp 98.3°F | Resp 20 | Ht 66.0 in | Wt 238.0 lb

## 2015-03-18 DIAGNOSIS — M25561 Pain in right knee: Secondary | ICD-10-CM | POA: Diagnosis not present

## 2015-03-18 DIAGNOSIS — M25461 Effusion, right knee: Secondary | ICD-10-CM | POA: Diagnosis not present

## 2015-03-18 LAB — POCT URINE PREGNANCY: Preg Test, Ur: NEGATIVE

## 2015-03-18 MED ORDER — MELOXICAM 15 MG PO TABS: 15.0000 mg | ORAL_TABLET | Freq: Every day | ORAL | Status: DC

## 2015-03-18 NOTE — Progress Notes (Signed)
Patient ID: Susan Hamilton, female   DOB: 1980/10/30, 34 y.o.   MRN: [id number] Pt independently evaluated by myself.  Reviewed documentation and xray and agree w/ assessment and plan. Recheck pt in sev d once pain and effusion have decreased some as pt was rather intolerant of exam today due to pain - weight bearing as tolerating on crutches, ace wrap, RICE.   Norberto SorensonEva Arlene Brickel, MD MPH

## 2015-03-18 NOTE — Progress Notes (Signed)
[facility] [address] [place] [address] [phone]  Date:  03/18/2015   Name:  Susan Hamilton   DOB:  [birth year]   MRN:  [id number]  PCP:  No PCP Per Patient    History of Present Illness:  Susan Hamilton is a 34 y.o. female patient [facility] pain and swelling that occurred yesterday. She has no known injury, however she was at her bachelorette party and states that she was jumping on a bed but does not remember hitting anything or falling. The pain is at the front of the knee.  It hurts to flex the knee.  She has no fever or warmth to the knee.  It does not radiate anywhere.  She has some knee swelling on the upper right side. She has taken nothing for the pain.  No posterior knee pain.     There are no active problems to display for this patient.   Past Medical History  Diagnosis Date  . Depression   . Anxiety   . Hypertension     after pregnancy only    Past Surgical History  Procedure Laterality Date  . Incision and drainage breast abscess    . Cesarean section      History  Substance Use Topics  . Smoking status: Never Smoker   . Smokeless tobacco: Not on file  . Alcohol Use: No    Family History  Problem Relation Age of Onset  . Diabetes Mother   . Hypertension Mother   . Cancer Paternal Grandmother     No Known Allergies  Medication list has been reviewed and updated.  Current Outpatient Prescriptions on File Prior to Visit  Medication Sig Dispense Refill  . Multiple Vitamin (MULTIVITAMIN WITH MINERALS) TABS tablet Take 1 tablet by mouth daily.     No current facility-administered medications on file prior to visit.    ROS ROS otherwise unremarkable unless listed above.  Physical Examination: BP 104/80 mmHg  Pulse 85  Temp(Src) 98.3 F (36.8 C) (Oral)  Resp 20  Ht  (1.676 m)  Wt 238 lb (107.956 kg)  BMI 38.43 kg/m2  SpO2 98%  LMP 12/04/2014 [facility]  Weight: Weight in (lb) to have BMI = 25: 154.6  Physical Exam Alert, cooperative, oriented 4. Normal heart rate. Right knee with lateral suprapatellar swelling without crepitus, erythema, or warmth. There is no true tenderness around the medial or lateral joint line and without laxity. Patella non-tender.  No patellar tendon tenderness. Negative anterior drawer test.  No tenderness at patella tendon.  Flexion ROM is limited due to pain.     [facility] reading (PRIMARY) by  Dr. Clelia Croft: Right knee with soft tissue swelling.  No fractures seen.    Assessment and Plan: 34 year old female with past medical history of hypertension, depression, and anxiety is here today for chief complaint of right knee pain and swelling. Injury could be likely, and possibility of gout. I am issuing her an anti-inflammatory at this time and advised RICE.   Compression bandaging around the knee improved knee pain and she was placed on crutches for easier mobility.  She was given work note for 10 days of athletic foot for acceptance and possible parking accessibility for work.  She will return in 3 days for follow-up.  Knee pain, right - Plan: POCT urine pregnancy, DG Knee Complete 4 Views Right, meloxicam (MOBIC) 15 MG tablet  Knee swelling, right  Trena PlattStephanie English, PA-C [facility] 03/18/2015 2:55 PM

## 2015-03-18 NOTE — Patient Instructions (Addendum)
Keep the knee wrapped when maneuvering during the day.   You can remove it for sleep and ice.  Make sure you are elevating the knee when you are at rest.  Wear shoes that give arch support. Please take the mobic without ibuprofen, naproxen, etc.  You can take a tylenol if needed.   Knee Pain The knee is the complex joint between your thigh and your lower leg. It is made up of bones, tendons, ligaments, and cartilage. The bones that make up the knee are:  The femur in the thigh.  The tibia and fibula in the lower leg.  The patella or kneecap riding in the groove on the lower femur. CAUSES  Knee pain is a common complaint with many causes. A few of these causes are:  Injury, such as:  A ruptured ligament or tendon injury.  Torn cartilage.  Medical conditions, such as:  Gout  Arthritis  Infections  Overuse, over training, or overdoing a physical activity. Knee pain can be minor or severe. Knee pain can accompany debilitating injury. Minor knee problems often respond well to self-care measures or get well on their own. More serious injuries may need medical intervention or even surgery. SYMPTOMS The knee is complex. Symptoms of knee problems can vary widely. Some of the problems are:  Pain with movement and weight bearing.  Swelling and tenderness.  Buckling of the knee.  Inability to straighten or extend your knee.  Your knee locks and you cannot straighten it.  Warmth and redness with pain and fever.  Deformity or dislocation of the kneecap. DIAGNOSIS  Determining what is wrong may be very straight forward such as when there is an injury. It can also be challenging because of the complexity of the knee. Tests to make a diagnosis may include:  Your caregiver taking a history and doing a physical exam.  Routine X-rays can be used to rule out other problems. X-rays will not reveal a cartilage tear. Some injuries of the knee can be diagnosed by:  Arthroscopy a  surgical technique by which a small video camera is inserted through tiny incisions on the sides of the knee. This procedure is used to examine and repair internal knee joint problems. Tiny instruments can be used during arthroscopy to repair the torn knee cartilage (meniscus).  Arthrography is a radiology technique. A contrast liquid is directly injected into the knee joint. Internal structures of the knee joint then become visible on X-ray film.  An MRI scan is a non X-ray radiology procedure in which magnetic fields and a computer produce two- or three-dimensional images of the inside of the knee. Cartilage tears are often visible using an MRI scanner. MRI scans have largely replaced arthrography in diagnosing cartilage tears of the knee.  Blood work.  Examination of the fluid that helps to lubricate the knee joint (synovial fluid). This is done by taking a sample out using a needle and a syringe. TREATMENT The treatment of knee problems depends on the cause. Some of these treatments are:  Depending on the injury, proper casting, splinting, surgery, or physical therapy care will be needed.  Give yourself adequate recovery time. Do not overuse your joints. If you begin to get sore during workout routines, back off. Slow down or do fewer repetitions.  For repetitive activities such as cycling or running, maintain your strength and nutrition.  Alternate muscle groups. For example, if you are a weight lifter, work the upper body on one day and the lower  body the next.  Either tight or weak muscles do not give the proper support for your knee. Tight or weak muscles do not absorb the stress placed on the knee joint. Keep the muscles surrounding the knee strong.  Take care of mechanical problems.  If you have flat feet, orthotics or special shoes may help. See your caregiver if you need help.  Arch supports, sometimes with wedges on the inner or outer aspect of the heel, can help. These can  shift pressure away from the side of the knee most bothered by osteoarthritis.  A brace called an "unloader" brace also may be used to help ease the pressure on the most arthritic side of the knee.  If your caregiver has prescribed crutches, braces, wraps or ice, use as directed. The acronym for this is PRICE. This means protection, rest, ice, compression, and elevation.  Nonsteroidal anti-inflammatory drugs (NSAIDs), can help relieve pain. But if taken immediately after an injury, they may actually increase swelling. Take NSAIDs with food in your stomach. Stop them if you develop stomach problems. Do not take these if you have a history of ulcers, stomach pain, or bleeding from the bowel. Do not take without your caregiver's approval if you have problems with fluid retention, heart failure, or kidney problems.  For ongoing knee problems, physical therapy may be helpful.  Glucosamine and chondroitin are over-the-counter dietary supplements. Both may help relieve the pain of osteoarthritis in the knee. These medicines are different from the usual anti-inflammatory drugs. Glucosamine may decrease the rate of cartilage destruction.  Injections of a corticosteroid drug into your knee joint may help reduce the symptoms of an arthritis flare-up. They may provide pain relief that lasts a few months. You may have to wait a few months between injections. The injections do have a small increased risk of infection, water retention, and elevated blood sugar levels.  Hyaluronic acid injected into damaged joints may ease pain and provide lubrication. These injections may work by reducing inflammation. A series of shots may give relief for as long as 6 months.  Topical painkillers. Applying certain ointments to your skin may help relieve the pain and stiffness of osteoarthritis. Ask your pharmacist for suggestions. Many over the-counter products are approved for temporary relief of arthritis pain.  In some  countries, doctors often prescribe topical NSAIDs for relief of chronic conditions such as arthritis and tendinitis. A review of treatment with NSAID creams found that they worked as well as oral medications but without the serious side effects. PREVENTION  Maintain a healthy weight. Extra pounds put more strain on your joints.  Get strong, stay limber. Weak muscles are a common cause of knee injuries. Stretching is important. Include flexibility exercises in your workouts.  Be smart about exercise. If you have osteoarthritis, chronic knee pain or recurring injuries, you may need to change the way you exercise. This does not mean you have to stop being active. If your knees ache after jogging or playing basketball, consider switching to swimming, water aerobics, or other low-impact activities, at least for a few days a week. Sometimes limiting high-impact activities will provide relief.  Make sure your shoes fit well. Choose footwear that is right for your sport.  Protect your knees. Use the proper gear for knee-sensitive activities. Use kneepads when playing volleyball or laying carpet. Buckle your seat belt every time you drive. Most shattered kneecaps occur in car accidents.  Rest when you are tired. SEEK MEDICAL CARE IF:  You have  knee pain that is continual and does not seem to be getting better.  SEEK IMMEDIATE MEDICAL CARE IF:  Your knee joint feels hot to the touch and you have a high fever. MAKE SURE YOU:   Understand these instructions.  Will watch your condition.  Will get help right away if you are not doing well or get worse. Document Released: 06/22/2007 Document Revised: 11/17/2011 Document Reviewed: 06/22/2007 [facility] Patient Information 2015 [place], [place]. This information is not intended to replace advice given to you by your health care provider. Make sure you discuss any questions you have with your health care provider. RICE: Routine Care for Injuries The routine  care of many injuries includes Rest, Ice, Compression, and Elevation (RICE). HOME CARE INSTRUCTIONS  Rest is needed to allow your body to heal. Routine activities can usually be resumed when comfortable. Injured tendons and bones can take up to 6 weeks to heal. Tendons are the cord-like structures that attach muscle to bone.  Ice following an injury helps keep the swelling down and reduces pain.  Put ice in a plastic bag.  Place a towel between your skin and the bag.  Leave the ice on for 15-20 minutes, 3-4 times a day, or as directed by your health care provider. Do this while awake, for the first 24 to 48 hours. After that, continue as directed by your caregiver.  Compression helps keep swelling down. It also gives support and helps with discomfort. If an elastic bandage has been applied, it should be removed and reapplied every 3 to 4 hours. It should not be applied tightly, but firmly enough to keep swelling down. Watch fingers or toes for swelling, bluish discoloration, coldness, numbness, or excessive pain. If any of these problems occur, remove the bandage and reapply loosely. Contact your caregiver if these problems continue.  Elevation helps reduce swelling and decreases pain. With extremities, such as the arms, hands, legs, and feet, the injured area should be placed near or above the level of the heart, if possible. SEEK IMMEDIATE MEDICAL CARE IF:  You have persistent pain and swelling.  You develop redness, numbness, or unexpected weakness.  Your symptoms are getting worse rather than improving after several days. These symptoms may indicate that further evaluation or further X-rays are needed. Sometimes, X-rays may not show a small broken bone (fracture) until 1 week or 10 days later. Make a follow-up appointment with your caregiver. Ask when your X-ray results will be ready. Make sure you get your X-ray results. Document Released: 12/07/2000 Document Revised: 08/30/2013 Document  Reviewed: 01/24/2011 [facility] Patient Information 2015 [place], [place]. This information is not intended to replace advice given to you by your health care provider. Make sure you discuss any questions you have with your health care provider.

## 2015-03-21 ENCOUNTER — Encounter: Payer: Self-pay | Admitting: Certified Nurse Midwife

## 2015-03-21 ENCOUNTER — Ambulatory Visit (INDEPENDENT_AMBULATORY_CARE_PROVIDER_SITE_OTHER): Payer: 59 | Admitting: Certified Nurse Midwife

## 2015-03-21 ENCOUNTER — Ambulatory Visit: Payer: 59 | Admitting: Certified Nurse Midwife

## 2015-03-21 ENCOUNTER — Telehealth: Payer: Self-pay | Admitting: Certified Nurse Midwife

## 2015-03-21 VITALS — BP 120/74 | HR 72 | Resp 20 | Ht 66.5 in | Wt 237.0 lb

## 2015-03-21 DIAGNOSIS — N644 Mastodynia: Secondary | ICD-10-CM

## 2015-03-21 NOTE — Progress Notes (Signed)
   Subjective:   34 y.o. SingleAfrican American female presents for follow up of left breast after mammogram and US for possible breast abscess due to tenderness. Patient had history of breast abscess x 2 with I and D for with breast feeding her daughter. Patient bought new bras as suggested at last OV and feel this has stopped the tenderness. She has noticed no redness, warmth or pain from breast. Denies nipple discharge or breast mass on exam other than scars on breast.  Busy with marriage plans and recent Right knee sprain, in support brace today. Daughter with her today.  Review of Systems Pertinent items are noted in HPI.  Mammogram indicated no evidence of abscess or mass only scar tissue and fibroglandular tissue. US agreed with above findings.   Objective:   General appearance: alert, cooperative and no distress Breasts: normal appearance, no masses or tenderness, No nipple retraction or dimpling, No nipple discharge or bleeding, No axillary or supraclavicular adenopathy, left breast positive for scarring only in 9 and 5 o'clock position, negative for tenderness.   Assessment:   ASSESSMENT:Patient is diagnosed with normal breast exam. History of breast abscess x 2 with I and D in left breast. No evidence of abscess on mammogram or US. Right knee injury under follow up   Plan:   PLAN: Discussed normal findings and reviewed imaging reports. Discussed SBE recommended monthly and continue with current bra use. Aware to advise if breast changes or concern. Questions addressed. Continue follow with injury on right knee. Wished well with upcoming marriage.  Rv prn

## 2015-03-21 NOTE — Patient Instructions (Signed)

## 2015-03-21 NOTE — Telephone Encounter (Signed)
Left message on voicemail to reschedule dnka appointment from today. °

## 2015-03-21 NOTE — Telephone Encounter (Addendum)
Patient called and rescheduled her appointment to 11:00am today. Patient was medicated and over slept, she is very sorry.

## 2015-03-22 NOTE — Progress Notes (Signed)
Reviewed personally.  M. Suzanne Rosaleigh Brazzel, MD.  

## 2015-04-09 DIAGNOSIS — M1711 Unilateral primary osteoarthritis, right knee: Secondary | ICD-10-CM

## 2015-04-09 DIAGNOSIS — S83289A Other tear of lateral meniscus, current injury, unspecified knee, initial encounter: Secondary | ICD-10-CM

## 2015-04-09 DIAGNOSIS — M94261 Chondromalacia, right knee: Secondary | ICD-10-CM

## 2015-04-09 HISTORY — DX: Chondromalacia, right knee: M94.261

## 2015-04-09 HISTORY — DX: Unilateral primary osteoarthritis, right knee: M17.11

## 2015-04-09 HISTORY — DX: Other tear of lateral meniscus, current injury, unspecified knee, initial encounter: S83.289A

## 2015-04-16 ENCOUNTER — Other Ambulatory Visit: Payer: Self-pay | Admitting: Physician Assistant

## 2015-04-16 NOTE — H&P (Signed)
Susan Hamilton is a new patient to the office who presents for evaluation and treatment recommendations for her right knee. She was at a bachelorette party and was very active Friday night. She does not recall a specific injury but it sounds like this was a fairly good celebration. She got up on Saturday with a little bit of swelling and soreness around her knee and a feeling of instability. This got worse, to a point when she got up on Sunday to go to church she got markedly stiff with trouble bearing weight. She could not drive home. She has been elevating this and staying off of it. She feels it is getting worse instead of better.  She is getting married this coming weekend and then going on her honeymoon to [place]. History and general exam is outlined and included in the chart.  EXAMINATION: Well-developed, well-nourished female in no acute distress.  Alert and oriented x 3.  Examination shows a markedly antalgic gait on the right. She has difficulty even putting her foot down. She has a 2+ effusion. I can get her through fairly good motion. Exquisitely tender in the medial joint line. There is pain with medial McMurray's. The collaterals are stable but her ACL feels a little bit soft on endpoint. She is neurovascularly intact distally.  X-RAYS: Four views somewhat standing X-rays shows no acute fracture fractures. No degenerative narrowing.  IMPRESSION: Marked reactive synovitis, possible underlying ACL, medial meniscus injury. DISPOSITION: Given the situation we have I will try and inject her to settle down the synovitis. I have given her a double upright knee brace. Cautious activity. Hopefully, this will get her through her wedding and honeymoon.   She is scheduled for an MRI when she returns to delineate the pathology and specific treatment recommendations. If this works, I will just see her then, if not she will let me know in the interim.  PROCEDURE: The patient's clinical condition is marked by  substantial pain and/or significant functional disability. Other conservative therapy has not provided relief, is contraindicated, or not appropriate. There is a reasonable likelihood that injection will significantly improve the patient's pain and/or functional disability.   After  appropriate consent and under sterile technique aspiration of some clear fluid , right knee, an intra-articular injection with d with  80 mg Depo-Medrol and 6 cc of Marcaine, accomplished atraumatically.  Patient tolerated the procedure well.  She feels better and hopefully, this will get her through the wedding.  Loreta Ave, M.D.   Addendum:  This is a pleasant 34 year-old female who presents to our clinic today to discuss MRI results of her right knee.  MRI from March 30, 2015 of her right knee reveals a small vertical tear anterior horn lateral meniscus, as well as high grade partial thickness cartilage loss lateral patella facet and lateral trochlea, partial thickness cartilage loss lateral aspect of the medial femoral condyle, as well as severe lateral patellar tilting.  [name] has tried an intraarticular Cortisone injection for relief of symptoms a few months ago, but this really only improved symptoms minimally.  At this point she would like to proceed with definitive treatment.    PLAN: Today we are going to go ahead and fill out paperwork to proceed with a right knee arthroscopy with partial lateral meniscectomy, as well as a lateral retinacular release.  Risks, benefits and possible complications of surgery have been reviewed.  Rehab and recovery time discussed. All questions answered.  Paperwork complete.  We will see  [name] at the time of operative intervention.    Loreta Ave, M.D.

## 2015-04-20 ENCOUNTER — Encounter (HOSPITAL_BASED_OUTPATIENT_CLINIC_OR_DEPARTMENT_OTHER): Payer: Self-pay | Admitting: *Deleted

## 2015-04-24 ENCOUNTER — Other Ambulatory Visit: Payer: Self-pay | Admitting: Physician Assistant

## 2015-04-24 NOTE — H&P (Signed)
This is a pleasant 34 year-old female who presents to our clinic today to discuss MRI results of her right knee.  MRI from March 30, 2015 of her right knee reveals a small vertical tear anterior horn lateral meniscus, as well as high grade partial thickness cartilage loss lateral patella facet and lateral trochlea, partial thickness cartilage loss lateral aspect of the medial femoral condyle, as well as severe lateral patellar tilting.  Susan Hamilton has tried an intraarticular Cortisone injection for relief of symptoms a few months ago, but this really only improved symptoms minimally.  At this point she would like to proceed with definitive treatment.    PLAN: Today we are going to go ahead and fill out paperwork to proceed with a right knee arthroscopy with partial lateral meniscectomy, as well as a lateral retinacular release.  Risks, benefits and possible complications of surgery have been reviewed.  Rehab and recovery time discussed. All questions answered.  Paperwork complete.  We will see Susan Hamilton at the time of operative intervention.

## 2015-04-25 ENCOUNTER — Other Ambulatory Visit: Payer: Self-pay | Admitting: Physician Assistant

## 2015-04-25 NOTE — H&P (Signed)
This is a pleasant 34 year-old female who presents to our clinic today to discuss MRI results of her right knee.  MRI from March 30, 2015 of her right knee reveals a small vertical tear anterior horn lateral meniscus, as well as high grade partial thickness cartilage loss lateral patella facet and lateral trochlea, partial thickness cartilage loss lateral aspect of the medial femoral condyle, as well as severe lateral patellar tilting.  Susan Hamilton has tried an intraarticular Cortisone injection for relief of symptoms a few months ago, but this really only improved symptoms minimally.  At this point she would like to proceed with definitive treatment.    PLAN: Today we are going to go ahead and fill out paperwork to proceed with a right knee arthroscopy with partial lateral meniscectomy, as well as a lateral retinacular release.  Risks, benefits and possible complications of surgery have been reviewed.  Rehab and recovery time discussed. All questions answered.  Paperwork complete.  We will see Susan Hamilton at the time of operative intervention.    Susan Hamilton, M.D.

## 2015-04-26 ENCOUNTER — Ambulatory Visit (HOSPITAL_BASED_OUTPATIENT_CLINIC_OR_DEPARTMENT_OTHER): Payer: 59 | Admitting: Certified Registered"

## 2015-04-26 ENCOUNTER — Encounter (HOSPITAL_BASED_OUTPATIENT_CLINIC_OR_DEPARTMENT_OTHER): Payer: Self-pay | Admitting: Certified Registered"

## 2015-04-26 ENCOUNTER — Ambulatory Visit (HOSPITAL_BASED_OUTPATIENT_CLINIC_OR_DEPARTMENT_OTHER)
Admission: RE | Admit: 2015-04-26 | Discharge: 2015-04-26 | Disposition: A | Discharge status: Home / Self Care | Payer: 59 | Source: Ambulatory Visit | Attending: Orthopedic Surgery | Admitting: Orthopedic Surgery

## 2015-04-26 ENCOUNTER — Encounter (HOSPITAL_BASED_OUTPATIENT_CLINIC_OR_DEPARTMENT_OTHER)
Admission: RE | Disposition: A | Discharge status: Home / Self Care | Payer: Self-pay | Source: Ambulatory Visit | Attending: Orthopedic Surgery

## 2015-04-26 DIAGNOSIS — X58XXXA Exposure to other specified factors, initial encounter: Secondary | ICD-10-CM | POA: Diagnosis not present

## 2015-04-26 DIAGNOSIS — M2241 Chondromalacia patellae, right knee: Secondary | ICD-10-CM | POA: Insufficient documentation

## 2015-04-26 DIAGNOSIS — M1990 Unspecified osteoarthritis, unspecified site: Secondary | ICD-10-CM | POA: Diagnosis not present

## 2015-04-26 DIAGNOSIS — M65861 Other synovitis and tenosynovitis, right lower leg: Secondary | ICD-10-CM | POA: Insufficient documentation

## 2015-04-26 DIAGNOSIS — S83281A Other tear of lateral meniscus, current injury, right knee, initial encounter: Secondary | ICD-10-CM | POA: Insufficient documentation

## 2015-04-26 HISTORY — PX: KNEE ARTHROSCOPY WITH LATERAL MENISECTOMY: SHX6193

## 2015-04-26 HISTORY — PX: KNEE ARTHROSCOPY WITH LATERAL RELEASE: SHX5649

## 2015-04-26 HISTORY — DX: Unilateral primary osteoarthritis, right knee: M17.11

## 2015-04-26 HISTORY — DX: Chondromalacia, right knee: M94.261

## 2015-04-26 HISTORY — DX: Irregular menstruation, unspecified: N92.6

## 2015-04-26 HISTORY — DX: Other tear of lateral meniscus, current injury, unspecified knee, initial encounter: S83.289A

## 2015-04-26 LAB — POCT HEMOGLOBIN-HEMACUE: HEMOGLOBIN: 12.7 g/dL (ref 12.0–15.0)

## 2015-04-26 SURGERY — ARTHROSCOPY, KNEE, WITH LATERAL MENISCECTOMY
Anesthesia: General | Site: Knee | Laterality: Right

## 2015-04-26 MED ORDER — OXYCODONE-ACETAMINOPHEN 5-325 MG PO TABS: 1.0000 | ORAL_TABLET | ORAL | Status: DC | PRN

## 2015-04-26 MED ORDER — FENTANYL CITRATE (PF) 100 MCG/2ML IJ SOLN
50.0000 ug | INTRAMUSCULAR | Status: AC | PRN
  Administered 2015-04-26 (×4): 50 ug via INTRAVENOUS

## 2015-04-26 MED ORDER — CHLORHEXIDINE GLUCONATE 4 % EX LIQD: 60.0000 mL | Freq: Once | CUTANEOUS | Status: DC

## 2015-04-26 MED ORDER — PROPOFOL 10 MG/ML IV BOLUS
INTRAVENOUS | Status: DC | PRN
  Administered 2015-04-26: 300 mg via INTRAVENOUS

## 2015-04-26 MED ORDER — KETOROLAC TROMETHAMINE 30 MG/ML IJ SOLN: 30.0000 mg | Freq: Once | INTRAMUSCULAR | Status: DC | PRN

## 2015-04-26 MED ORDER — ONDANSETRON HCL 4 MG PO TABS: 4.0000 mg | ORAL_TABLET | Freq: Three times a day (TID) | ORAL | Status: DC | PRN

## 2015-04-26 MED ORDER — ONDANSETRON HCL 4 MG/2ML IJ SOLN
INTRAMUSCULAR | Status: DC | PRN
  Administered 2015-04-26: 4 mg via INTRAVENOUS

## 2015-04-26 MED ORDER — HYDROMORPHONE HCL 1 MG/ML IJ SOLN
0.2500 mg | INTRAMUSCULAR | Status: DC | PRN
  Administered 2015-04-26 (×4): 0.5 mg via INTRAVENOUS

## 2015-04-26 MED ORDER — LACTATED RINGERS IV SOLN
INTRAVENOUS | Status: DC
  Administered 2015-04-26 (×2): via INTRAVENOUS

## 2015-04-26 MED ORDER — OXYCODONE HCL 5 MG PO TABS
5.0000 mg | ORAL_TABLET | Freq: Once | ORAL | Status: AC
  Administered 2015-04-26: 5 mg via ORAL

## 2015-04-26 MED ORDER — LIDOCAINE HCL (CARDIAC) 20 MG/ML IV SOLN
INTRAVENOUS | Status: DC | PRN
  Administered 2015-04-26: 60 mg via INTRAVENOUS

## 2015-04-26 MED ORDER — BUPIVACAINE HCL (PF) 0.5 % IJ SOLN
INTRAMUSCULAR | Status: DC | PRN
  Administered 2015-04-26: 20 mL

## 2015-04-26 MED ORDER — HYDROMORPHONE HCL 1 MG/ML IJ SOLN
INTRAMUSCULAR | Status: AC
  Filled 2015-04-26: qty 1

## 2015-04-26 MED ORDER — SCOPOLAMINE 1 MG/3DAYS TD PT72: 1.0000 | MEDICATED_PATCH | Freq: Once | TRANSDERMAL | Status: DC | PRN

## 2015-04-26 MED ORDER — LACTATED RINGERS IV SOLN: INTRAVENOUS | Status: DC

## 2015-04-26 MED ORDER — FENTANYL CITRATE (PF) 100 MCG/2ML IJ SOLN
INTRAMUSCULAR | Status: AC
  Filled 2015-04-26: qty 4

## 2015-04-26 MED ORDER — MEPERIDINE HCL 25 MG/ML IJ SOLN: 6.2500 mg | INTRAMUSCULAR | Status: DC | PRN

## 2015-04-26 MED ORDER — CEFAZOLIN SODIUM-DEXTROSE 2-3 GM-% IV SOLR
2.0000 g | INTRAVENOUS | Status: AC
  Administered 2015-04-26: 2 g via INTRAVENOUS

## 2015-04-26 MED ORDER — MIDAZOLAM HCL 2 MG/2ML IJ SOLN
1.0000 mg | INTRAMUSCULAR | Status: DC | PRN
  Administered 2015-04-26: 2 mg via INTRAVENOUS

## 2015-04-26 MED ORDER — PROMETHAZINE HCL 25 MG/ML IJ SOLN: 6.2500 mg | INTRAMUSCULAR | Status: DC | PRN

## 2015-04-26 MED ORDER — CEFAZOLIN SODIUM-DEXTROSE 2-3 GM-% IV SOLR
INTRAVENOUS | Status: AC
  Filled 2015-04-26: qty 50

## 2015-04-26 MED ORDER — DEXAMETHASONE SODIUM PHOSPHATE 4 MG/ML IJ SOLN
INTRAMUSCULAR | Status: DC | PRN
  Administered 2015-04-26: 10 mg via INTRAVENOUS

## 2015-04-26 MED ORDER — GLYCOPYRROLATE 0.2 MG/ML IJ SOLN: 0.2000 mg | Freq: Once | INTRAMUSCULAR | Status: DC | PRN

## 2015-04-26 MED ORDER — OXYCODONE HCL 5 MG PO TABS
ORAL_TABLET | ORAL | Status: AC
  Filled 2015-04-26: qty 1

## 2015-04-26 MED ORDER — SODIUM CHLORIDE 0.9 % IR SOLN
Status: DC | PRN
  Administered 2015-04-26: 6000 mL

## 2015-04-26 MED ORDER — PROPOFOL 500 MG/50ML IV EMUL
INTRAVENOUS | Status: AC
  Filled 2015-04-26: qty 50

## 2015-04-26 MED ORDER — MIDAZOLAM HCL 2 MG/2ML IJ SOLN
INTRAMUSCULAR | Status: AC
  Filled 2015-04-26: qty 2

## 2015-04-26 SURGICAL SUPPLY — 40 items
BANDAGE ELASTIC 6 VELCRO ST LF (GAUZE/BANDAGES/DRESSINGS) ×2 IMPLANT
BLADE CUDA 5.5 (BLADE) IMPLANT
BLADE CUDA GRT WHITE 3.5 (BLADE) IMPLANT
BLADE CUTTER GATOR 3.5 (BLADE) ×2 IMPLANT
BLADE CUTTER MENIS 5.5 (BLADE) IMPLANT
BLADE GREAT WHITE 4.2 (BLADE) ×2 IMPLANT
BUR OVAL 4.0 (BURR) IMPLANT
CUTTER MENISCUS  4.2MM (BLADE)
CUTTER MENISCUS 4.2MM (BLADE) IMPLANT
DRAPE ARTHROSCOPY W/POUCH 90 (DRAPES) ×2 IMPLANT
DURAPREP 26ML APPLICATOR (WOUND CARE) ×2 IMPLANT
ELECT MENISCUS 165MM 90D (ELECTRODE) ×2 IMPLANT
ELECT REM PT RETURN 9FT ADLT (ELECTROSURGICAL) ×2
ELECTRODE REM PT RTRN 9FT ADLT (ELECTROSURGICAL) ×1 IMPLANT
GAUZE SPONGE 4X4 12PLY STRL (GAUZE/BANDAGES/DRESSINGS) ×4 IMPLANT
GAUZE XEROFORM 1X8 LF (GAUZE/BANDAGES/DRESSINGS) ×2 IMPLANT
GLOVE BIOGEL M STRL SZ7.5 (GLOVE) ×2 IMPLANT
GLOVE BIOGEL PI IND STRL 7.0 (GLOVE) ×2 IMPLANT
GLOVE BIOGEL PI INDICATOR 7.0 (GLOVE) ×2
GLOVE ECLIPSE 6.5 STRL STRAW (GLOVE) ×2 IMPLANT
GLOVE ECLIPSE 7.0 STRL STRAW (GLOVE) ×2 IMPLANT
GLOVE ORTHO TXT STRL SZ7.5 (GLOVE) ×2 IMPLANT
GLOVE SURG ORTHO 8.0 STRL STRW (GLOVE) ×2 IMPLANT
GOWN STRL REUS W/ TWL LRG LVL3 (GOWN DISPOSABLE) ×3 IMPLANT
GOWN STRL REUS W/ TWL XL LVL3 (GOWN DISPOSABLE) ×1 IMPLANT
GOWN STRL REUS W/TWL LRG LVL3 (GOWN DISPOSABLE) ×6
GOWN STRL REUS W/TWL XL LVL3 (GOWN DISPOSABLE) ×2
HOLDER KNEE FOAM BLUE (MISCELLANEOUS) ×2 IMPLANT
IV NS IRRIG 3000ML ARTHROMATIC (IV SOLUTION) ×8 IMPLANT
KNEE WRAP E Z 3 GEL PACK (MISCELLANEOUS) ×2 IMPLANT
MANIFOLD NEPTUNE II (INSTRUMENTS) ×2 IMPLANT
PACK ARTHROSCOPY DSU (CUSTOM PROCEDURE TRAY) ×2 IMPLANT
PACK BASIN DAY SURGERY FS (CUSTOM PROCEDURE TRAY) ×2 IMPLANT
PENCIL BUTTON HOLSTER BLD 10FT (ELECTRODE) ×2 IMPLANT
SET ARTHROSCOPY TUBING (MISCELLANEOUS) ×2
SET ARTHROSCOPY TUBING LN (MISCELLANEOUS) ×1 IMPLANT
SUT ETHILON 3 0 PS 1 (SUTURE) ×2 IMPLANT
SUT VIC AB 3-0 FS2 27 (SUTURE) IMPLANT
TOWEL OR 17X24 6PK STRL BLUE (TOWEL DISPOSABLE) ×2 IMPLANT
WATER STERILE IRR 1000ML POUR (IV SOLUTION) ×2 IMPLANT

## 2015-04-26 NOTE — Transfer of Care (Signed)
Immediate Anesthesia Transfer of Care Note  Patient: Susan Hamilton  Procedure(s) Performed: Procedure(s): RIGHT KNEE ARTHROSCOPY WITH LATERAL MENISCECTOMY, LATERAL RELEASE (Right) KNEE ARTHROSCOPY WITH LATERAL RELEASE (Right)  Patient Location: PACU  Anesthesia Type:General  Level of Consciousness: awake, sedated and responds to stimulation  Airway & Oxygen Therapy: Patient Spontanous Breathing and Patient connected to face mask oxygen  Post-op Assessment: Report given to RN, Post -op Vital signs reviewed and stable and Patient moving all extremities  Post vital signs: Reviewed and stable  Last Vitals:  Filed Vitals:   04/26/15 1140  BP:   Pulse: 83  Temp:   Resp: 12    Complications: No apparent anesthesia complications

## 2015-04-26 NOTE — H&P (View-Only) (Signed)
Susan Hamilton is a new patient to the office who presents for evaluation and treatment recommendations for her right knee. She was at a bachelorette party and was very active Friday night. She does not recall a specific injury but it sounds like this was a fairly good celebration. She got up on Saturday with a little bit of swelling and soreness around her knee and a feeling of instability. This got worse, to a point when she got up on Sunday to go to church she got markedly stiff with trouble bearing weight. She could not drive home. She has been elevating this and staying off of it. She feels it is getting worse instead of better.  She is getting married this coming weekend and then going on her honeymoon to [place]. History and general exam is outlined and included in the chart.  EXAMINATION: Well-developed, well-nourished female in no acute distress.  Alert and oriented x 3.  Examination shows a markedly antalgic gait on the right. She has difficulty even putting her foot down. She has a 2+ effusion. I can get her through fairly good motion. Exquisitely tender in the medial joint line. There is pain with medial McMurray's. The collaterals are stable but her ACL feels a little bit soft on endpoint. She is neurovascularly intact distally.  X-RAYS: Four views somewhat standing X-rays shows no acute fracture fractures. No degenerative narrowing.  IMPRESSION: Marked reactive synovitis, possible underlying ACL, medial meniscus injury. DISPOSITION: Given the situation we have I will try and inject her to settle down the synovitis. I have given her a double upright knee brace. Cautious activity. Hopefully, this will get her through her wedding and honeymoon.   She is scheduled for an MRI when she returns to delineate the pathology and specific treatment recommendations. If this works, I will just see her then, if not she will let me know in the interim.  PROCEDURE: The patient's clinical condition is marked by  substantial pain and/or significant functional disability. Other conservative therapy has not provided relief, is contraindicated, or not appropriate. There is a reasonable likelihood that injection will significantly improve the patient's pain and/or functional disability.   After  appropriate consent and under sterile technique aspiration of some clear fluid , right knee, an intra-articular injection with d with  80 mg Depo-Medrol and 6 cc of Marcaine, accomplished atraumatically.  Patient tolerated the procedure well.  She feels better and hopefully, this will get her through the wedding.  Loreta Ave, M.D.   Addendum:  This is a pleasant 34 year-old female who presents to our clinic today to discuss MRI results of her right knee.  MRI from March 30, 2015 of her right knee reveals a small vertical tear anterior horn lateral meniscus, as well as high grade partial thickness cartilage loss lateral patella facet and lateral trochlea, partial thickness cartilage loss lateral aspect of the medial femoral condyle, as well as severe lateral patellar tilting.  Susan Hamilton has tried an intraarticular Cortisone injection for relief of symptoms a few months ago, but this really only improved symptoms minimally.  At this point she would like to proceed with definitive treatment.    PLAN: Today we are going to go ahead and fill out paperwork to proceed with a right knee arthroscopy with partial lateral meniscectomy, as well as a lateral retinacular release.  Risks, benefits and possible complications of surgery have been reviewed.  Rehab and recovery time discussed. All questions answered.  Paperwork complete.  We will see  Susan Hamilton at the time of operative intervention.    Loreta Ave, M.D.

## 2015-04-26 NOTE — Interval H&P Note (Signed)
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 After consideration of risks, benefits and other options for treatment, the patient has consented to  Procedure(s): RIGHT KNEE ARTHROSCOPY WITH LATERAL MENISCECTOMY, LATERAL RELEASE (Right) as a surgical intervention .  The patient's history has been reviewed, patient examined, no change in status, stable for surgery.  I have reviewed the patient's chart and labs.  Questions were answered to the patient's satisfaction.     Loreta Ave

## 2015-04-26 NOTE — Anesthesia Procedure Notes (Signed)
Procedure Name: LMA Insertion Date/Time: 04/26/2015 10:53 AM Performed by: Curly [place] Pre-anesthesia Checklist: Patient identified, Emergency Drugs available, Suction available and Patient being monitored Patient Re-evaluated:Patient Re-evaluated prior to inductionOxygen Delivery Method: Circle System Utilized Preoxygenation: Pre-oxygenation with 100% oxygen Intubation Type: IV induction Ventilation: Mask ventilation without difficulty LMA: LMA inserted LMA Size: 4.0 Number of attempts: 1 Airway Equipment and Method: Bite block Placement Confirmation: positive ETCO2 and breath sounds checked- equal and bilateral Tube secured with: Tape Dental Injury: Teeth and Oropharynx as per pre-operative assessment

## 2015-04-26 NOTE — Anesthesia Preprocedure Evaluation (Signed)
Anesthesia Evaluation  Patient identified by MRN, date of birth, ID band Patient awake    Reviewed: Allergy & Precautions, NPO status , Patient's Chart, lab work & pertinent test results  Airway Mallampati: II  TM Distance: >3 FB Neck ROM: Full    Dental no notable dental hx.    Pulmonary neg pulmonary ROS,  breath sounds clear to auscultation  Pulmonary exam normal       Cardiovascular negative cardio ROS Normal cardiovascular examRhythm:Regular Rate:Normal     Neuro/Psych negative neurological ROS  negative psych ROS   GI/Hepatic negative GI ROS, Neg liver ROS,   Endo/Other  negative endocrine ROS  Renal/GU negative Renal ROS     Musculoskeletal  (+) Arthritis -,   Abdominal   Peds  Hematology negative hematology ROS (+)   Anesthesia Other Findings   Reproductive/Obstetrics negative OB ROS                             Anesthesia Physical Anesthesia Plan  ASA: II  Anesthesia Plan: General   Post-op Pain Management:    Induction: Intravenous  Airway Management Planned: LMA  Additional Equipment:   Intra-op Plan:   Post-operative Plan: Extubation in OR  Informed Consent: I have reviewed the patients History and Physical, chart, labs and discussed the procedure including the risks, benefits and alternatives for the proposed anesthesia with the patient or authorized representative who has indicated his/her understanding and acceptance.   Dental advisory given  Plan Discussed with: CRNA  Anesthesia Plan Comments:         Anesthesia Quick Evaluation

## 2015-04-26 NOTE — Anesthesia Postprocedure Evaluation (Signed)
Anesthesia Post Note  Patient: Susan Hamilton  Procedure(s) Performed: Procedure(s) (LRB): RIGHT KNEE ARTHROSCOPY WITH LATERAL MENISCECTOMY, LATERAL RELEASE (Right) KNEE ARTHROSCOPY WITH LATERAL RELEASE (Right)  Anesthesia type: General  Patient location: PACU  Post pain: Pain level controlled  Post assessment: Post-op Vital signs reviewed  Last Vitals: BP 108/66 mmHg  Pulse 78  Temp(Src) 36.4 C (Oral)  Resp 12  Ht 5' 5.5" (1.664 m)  Wt 239 lb (108.41 kg)  BMI 39.15 kg/m2  SpO2 98%  Post vital signs: Reviewed  Level of consciousness: sedated  Complications: No apparent anesthesia complications

## 2015-04-26 NOTE — Discharge Instructions (Signed)

## 2015-04-27 ENCOUNTER — Encounter (HOSPITAL_BASED_OUTPATIENT_CLINIC_OR_DEPARTMENT_OTHER): Payer: Self-pay | Admitting: Orthopedic Surgery

## 2015-04-27 NOTE — Op Note (Signed)
NAME:  EVANEE, LUBRANO NO.:  [id number]  MEDICAL RECORD NO.:  [id number]  LOCATION:                               FACILITY:  [facility]  PHYSICIAN:  Loreta Ave, M.D. DATE OF BIRTH:  Dec 31, 1980  DATE OF PROCEDURE:  04/26/2015 DATE OF DISCHARGE:  04/26/2015                              OPERATIVE REPORT   PREOPERATIVE DIAGNOSES:  Right knee marked lateral patellofemoral tracking.  Chondromalacia lateral facet patella.  Lateral tethering. Radial tear anterior third lateral meniscus.  POSTOPERATIVE DIAGNOSES:  Right knee marked lateral patellofemoral tracking.  Chondromalacia lateral facet patella.  Lateral tethering. Radial tear anterior third lateral meniscus with the radial tear being a little bit more towards the middle third.  PROCEDURE:  Right knee exam under anesthesia, arthroscopy.  Partial lateral meniscectomy.  Chondroplasty patella.  Arthroscopic lateral retinacular release.  SURGEON:  Loreta Ave, M.D.  ASSISTANT:  Mikey Kirschner, PA  ANESTHESIA:  General.  BLOOD LOSS:  Minimal.  SPECIMENS:  None.  CULTURES:  None.  COMPLICATIONS:  None.  DRESSING:  Soft compressive.  TOURNIQUET:  None.  DESCRIPTION OF PROCEDURE:  The patient was brought to the operating room, and placed on the operating table in supine position.  After adequate anesthesia had been obtained, leg holder applied.  Leg prepped and draped in usual sterile fashion.  Two portals, one each medial and lateral parapatellar.  Arthroscope was introduced.  Knee distended and inspected.  Grade 3 changes of lateral patella debrided.  Lateral tracking tethering confirmed.  Medial meniscus, medial compartment, and cruciate ligaments normal.  Radial tear midportion lateral meniscus saucerized out and tapered in smoothly on either side.  I then looked to tracking from all angles.  With cautery I did a lateral release with the vastus lateralis superiorly down the lateral joint line  inferiorly. Marked improvement of tethering confirmed viewing from all portals.  Instruments were fully removed. Portals were closed with nylon.  Knee injected with Depo-Medrol and Marcaine.  Sterile compressive dressing applied.  Anesthesia reversed. Brought to the recovery room.  Tolerated the surgery well.  No complications.     Loreta Ave, M.D.     DFM/MEDQ  D:  04/26/2015  T:  04/27/2015  Job:  [id number]

## 2015-05-01 ENCOUNTER — Emergency Department (HOSPITAL_COMMUNITY)
Admission: EM | Admit: 2015-05-01 | Discharge: 2015-05-01 | Disposition: A | Discharge status: Home / Self Care | Payer: 59 | Attending: Emergency Medicine | Admitting: Emergency Medicine

## 2015-05-01 ENCOUNTER — Encounter (HOSPITAL_COMMUNITY): Payer: Self-pay | Admitting: *Deleted

## 2015-05-01 ENCOUNTER — Other Ambulatory Visit (HOSPITAL_COMMUNITY): Payer: Self-pay | Admitting: Orthopedic Surgery

## 2015-05-01 ENCOUNTER — Inpatient Hospital Stay (HOSPITAL_COMMUNITY): Admission: RE | Admit: 2015-05-01 | Payer: 59 | Source: Ambulatory Visit

## 2015-05-01 DIAGNOSIS — M79604 Pain in right leg: Secondary | ICD-10-CM

## 2015-05-01 MED ORDER — ENOXAPARIN SODIUM 100 MG/ML ~~LOC~~ SOLN
100.0000 mg | Freq: Once | SUBCUTANEOUS | Status: AC
  Administered 2015-05-01: 100 mg via SUBCUTANEOUS
  Filled 2015-05-01: qty 1

## 2015-05-01 NOTE — ED Provider Notes (Signed)
CSN: [id number]     Arrival date & time 05/01/15  1837 History  This chart was scribed for non-physician practitioner, Everlene Farrier, PA-C working with Samuel Jester, DO by Gwenyth Ober, ED scribe. This patient was seen in room TR05C/TR05C and the patient's care was started at 7:35 PM   Chief Complaint  Patient presents with  . Leg Pain   The history is provided by the patient. No language interpreter was used.    HPI Comments: Susan Hamilton is a 34 y.o. female who presents to the Emergency Department complaining of constant, 8/10, generalized right lower leg pain that radiates to her right foot and started yesterday. She states chills that occurred 1 day ago as an associated symptom. Pt has tried hydrocodone with no relief. Pt had right knee arthroscopy with lateral menisectomy on 8/18. She followed up at the orthopedist today who ordered an [place] regarding her current pain. Pt was unable to get the testing done before ultrasound office closing and was referred to the ED for evaluation of a blood clot. Pt denies a family history of PE/DVT or blood clotting disorders or estrogen use. She also denies drainage from her surgical sites, fevers, CP, SOB, abdominal pain, nausea, vomiting and numbness as associated symptoms.   Past Medical History  Diagnosis Date  . Osteoarthritis of right knee 04/2015  . Lateral meniscal tear 04/2015    right knee  . Chondromalacia of right knee 04/2015  . Irregular periods     LMP 11/2014 - denies chance of pregnancy   Past Surgical History  Procedure Laterality Date  . Incision and drainage breast abscess Left   . Cesarean section  05/01/2007  . Knee arthroscopy with lateral menisectomy Right 04/26/2015    Procedure: RIGHT KNEE ARTHROSCOPY WITH LATERAL MENISCECTOMY, LATERAL RELEASE;  Surgeon: Loreta Ave, MD;  Location: [facility];  Service: Orthopedics;  Laterality: Right;  . Knee arthroscopy with lateral release Right 04/26/2015     Procedure: KNEE ARTHROSCOPY WITH LATERAL RELEASE;  Surgeon: Loreta Ave, MD;  Location: [facility];  Service: Orthopedics;  Laterality: Right;   Family History  Problem Relation Age of Onset  . Diabetes Mother   . Hypertension Mother   . Cancer Paternal Grandmother    Social History  Substance Use Topics  . Smoking status: Never Smoker   . Smokeless tobacco: Never Used  . Alcohol Use: No   OB History    Gravida Para Term Preterm AB TAB SAB Ectopic Multiple Living   2 1 1  1  1   2      Review of Systems  Constitutional: Positive for chills. Negative for fever.  Respiratory: Negative for cough, chest tightness, shortness of breath and wheezing.   Cardiovascular: Negative for chest pain and palpitations.  Gastrointestinal: Negative for nausea, vomiting and abdominal pain.  Musculoskeletal: Positive for joint swelling and arthralgias.  Skin: Positive for wound. Negative for color change and rash.  Neurological: Negative for weakness and numbness.      Allergies  Review of patient's allergies indicates no known allergies.  Home Medications   Prior to Admission medications   Medication Sig Start Date End Date Taking? Authorizing Provider  ondansetron (ZOFRAN) 4 MG tablet Take 1 tablet (4 mg total) by mouth every 8 (eight) hours as needed for nausea or vomiting. 04/26/15   Cristie Hem, PA-C  oxyCODONE-acetaminophen (ROXICET) 5-325 MG per tablet Take 1-2 tablets by mouth every 4 (four) hours as needed.  04/26/15   Cristie Hem, PA-C   BP 116/78 mmHg  Pulse 81  Temp(Src) 98.4 F (36.9 C) (Oral)  Resp 18  SpO2 99% Physical Exam  Constitutional: She appears well-developed and well-nourished. No distress.  Nontoxic appearing.  HENT:  Head: Normocephalic and atraumatic.  Eyes: Conjunctivae are normal. Pupils are equal, round, and reactive to light. Right eye exhibits no discharge. Left eye exhibits no discharge.  Neck: Neck supple.  Cardiovascular:  Normal rate, regular rhythm, normal heart sounds and intact distal pulses.   Bilateral dorsalis pedis and posterior tibialis pulses are intact. Good capillary refill to her bilateral lower extremities.   Pulmonary/Chest: Effort normal and breath sounds normal. No respiratory distress. She has no wheezes. She has no rales.  Abdominal: Soft. There is no tenderness.  Musculoskeletal: She exhibits edema.  Right anterior knee edema with mild tenderness. No right or left calf edema or tenderness. No pedal edema. Surgical incision sites to her right anterior knee are clean and dry without discharge, or warmth. No rashes or warmth noted to bilateral lower extremities.   Lymphadenopathy:    She has no cervical adenopathy.  Neurological: She is alert. Coordination normal.  Sensation is intact in her bilateral lower extremities.  Skin: Skin is warm and dry. No rash noted. She is not diaphoretic. No erythema. No pallor.  Psychiatric: She has a normal mood and affect. Her behavior is normal.  Nursing note and vitals reviewed.   ED Course  Procedures   DIAGNOSTIC STUDIES: Oxygen Saturation is 100% on RA, normal by my interpretation.    COORDINATION OF CARE: 7:48 PM Discussed treatment plan with pt which includes consultation with Dr. Clarene Duke. Will order Lovenox and advise pt to follow-up for [place] in the morning. Pt agreed to plan.   Labs Review Labs Reviewed - No data to display  Imaging Review No results found.   EKG Interpretation None      Filed Vitals:   05/01/15 1912 05/01/15 2003  BP: 117/76 116/78  Pulse: 88 81  Temp: 98.4 F (36.9 C) 98.4 F (36.9 C)  TempSrc: Oral Oral  Resp: 16 18  SpO2: 100% 99%     MDM   Meds given in ED:  Medications  enoxaparin (LOVENOX) injection 100 mg (100 mg Subcutaneous Given 05/01/15 2048)    New Prescriptions   No medications on file    Final diagnoses:  Right leg pain   This is a 34 year old female who presented to the emergency room  complaining of right knee and leg pain for the past two days. Patient reports she had right knee arthroscopically by Dr. Mckinley Jewel on 04/26/2015. Patient put she was seen by the orthopedic PA today who gave her prescription for a muscle relaxer and advised her to go to have an ultrasound done. Patient did not make it to the ultrasound. She is here requesting to get the ultrasound done now. On exam the patient is afebrile nontoxic appearing. Patient does have right anterior knee edema but no calf edema or tenderness. Patient is neurovascularly intact. Unfortunately, as it is after 7 PM we do not have in house ultrasound at this time. Will provide patient with lovenox injection and have her get ultrasound in the AM. Patient also reports she has an ultrasound scheduled by her orthopedic surgeon for tomorrow. I advised her she could keep this appointment or come to account ultrasound done here. She received Lovenox in the ED. Strict return precautions provided. I advised the patient to  follow-up with their primary care provider this week. I advised the patient to return to the emergency department with new or worsening symptoms or new concerns. The patient verbalized understanding and agreement with plan.    This patient was discussed with Dr. Clarene Duke who agrees with assessment and plan.  I personally performed the services described in this documentation, which was scribed in my presence. The recorded information has been reviewed and is accurate.    Everlene Farrier, PA-C 05/01/15 2054  Samuel Jester, DO 05/04/15 [phone]

## 2015-05-01 NOTE — ED Notes (Signed)
Pt c/o right leg pain since last night. Pt had recent right knee surgery 04/26/15. Pt talked to surgeon today and was instructed to come to ED for eval of possible blood clot. CMS intact. Pt also c/o of spasms all up and down her right leg

## 2015-05-01 NOTE — Discharge Instructions (Signed)
Please keep your appointment for ultrasound tomorrow to rule out a DVT. You were given your first dose of Lovenox (Blood thinner) here in the ED. See your orthopedic surgeon for further.  Possible Deep Vein Thrombosis A deep vein thrombosis (DVT) is a blood clot that develops in the deep, larger veins of the leg, arm, or pelvis. These are more dangerous than clots that might form in veins near the surface of the body. A DVT can lead to serious and even life-threatening complications if the clot breaks off and travels in the bloodstream to the lungs.  A DVT can damage the valves in your leg veins so that instead of flowing upward, the blood pools in the lower leg. This is called post-thrombotic syndrome, and it can result in pain, swelling, discoloration, and sores on the leg. CAUSES Usually, several things contribute to the formation of blood clots. Contributing factors include:  The flow of blood slows down.  The inside of the vein is damaged in some way.  You have a condition that makes blood clot more easily. RISK FACTORS Some people are more likely than others to develop blood clots. Risk factors include:   Smoking.  Being overweight (obese).  Sitting or lying still for a long time. This includes long-distance travel, paralysis, or recovery from an illness or surgery. Other factors that increase risk are:   Older age, especially over 34 years of age.  Having a family history of blood clots or if you have already had a blot clot.  Having major or lengthy surgery. This is especially true for surgery on the hip, knee, or belly (abdomen). Hip surgery is particularly high risk.  Having a long, thin tube (catheter) placed inside a vein during a medical procedure.  Breaking a hip or leg.  Having cancer or cancer treatment.  Pregnancy and childbirth.  Hormone changes make the blood clot more easily during pregnancy.  The fetus puts pressure on the veins of the pelvis.  There is a  risk of injury to veins during delivery or a caesarean delivery. The risk is highest just after childbirth.  Medicines containing the female hormone estrogen. This includes birth control pills and hormone replacement therapy.  Other circulation or heart problems.  SIGNS AND SYMPTOMS When a clot forms, it can either partially or totally block the blood flow in that vein. Symptoms of a DVT can include:  Swelling of the leg or arm, especially if one side is much worse.  Warmth and redness of the leg or arm, especially if one side is much worse.  Pain in an arm or leg. If the clot is in the leg, symptoms may be more noticeable or worse when standing or walking. The symptoms of a DVT that has traveled to the lungs (pulmonary embolism, PE) usually start suddenly and include:  Shortness of breath.  Coughing.  Coughing up blood or blood-tinged mucus.  Chest pain. The chest pain is often worse with deep breaths.  Rapid heartbeat. Anyone with these symptoms should get emergency medical treatment right away. Do not wait to see if the symptoms will go away. Call your local emergency services (911 in the [place] if you have these symptoms. Do not drive yourself to the hospital. DIAGNOSIS If a DVT is suspected, your health care provider will take a full medical history and perform a physical exam. Tests that also may be required include:  Blood tests, including studies of the clotting properties of the blood.  Ultrasound to see  if you have clots in your legs or lungs.  X-rays to show the flow of blood when dye is injected into the veins (venogram).  Studies of your lungs if you have any chest symptoms. PREVENTION  Exercise the legs regularly. Take a brisk 30-minute walk every day.  Maintain a weight that is appropriate for your height.  Avoid sitting or lying in bed for long periods of time without moving your legs.  Women, particularly those over the age of 35 years, should consider the  risks and benefits of taking estrogen medicines, including birth control pills.  Do not smoke, especially if you take estrogen medicines.  Long-distance travel can increase your risk of DVT. You should exercise your legs by walking or pumping the muscles every hour.  Many of the risk factors above relate to situations that exist with hospitalization, either for illness, injury, or elective surgery. Prevention may include medical and nonmedical measures.  Your health care provider will assess you for the need for venous thromboembolism prevention when you are admitted to the hospital. If you are having surgery, your surgeon will assess you the day of or day after surgery. TREATMENT Once identified, a DVT can be treated. It can also be prevented in some circumstances. Once you have had a DVT, you may be at increased risk for a DVT in the future. The most common treatment for DVT is blood-thinning (anticoagulant) medicine, which reduces the blood's tendency to clot. Anticoagulants can stop new blood clots from forming and stop old clots from growing. They cannot dissolve existing clots. Your body does this by itself over time. Anticoagulants can be given by mouth, through an IV tube, or by injection. Your health care provider will determine the best program for you. Other medicines or treatments that may be used are:  Heparin or related medicines (low molecular weight heparin) are often the first treatment for a blood clot. They act quickly. However, they cannot be taken orally and must be given either in shot form or by IV tube.  Heparin can cause a fall in a component of blood that stops bleeding and forms blood clots (platelets). You will be monitored with blood tests to be sure this does not occur.  Warfarin is an anticoagulant that can be swallowed. It takes a few days to start working, so usually heparin or related medicines are used in combination. Once warfarin is working, heparin is usually  stopped.  Factor Xa inhibitor medicines, such as rivaroxaban and apixaban, also reduce blood clotting. These medicines are taken orally and can often be used without heparin or related medicines.  Less commonly, clot dissolving drugs (thrombolytics) are used to dissolve a DVT. They carry a high risk of bleeding, so they are used mainly in severe cases where your life or a part of your body is threatened.  Very rarely, a blood clot in the leg needs to be removed surgically.  If you are unable to take anticoagulants, your health care provider may arrange for you to have a filter placed in a main vein in your abdomen. This filter prevents clots from traveling to your lungs. HOME CARE INSTRUCTIONS  Take all medicines as directed by your health care provider.  Learn as much as you can about DVT.  Wear a medical alert bracelet or carry a medical alert card.  Ask your health care provider how soon you can go back to normal activities. It is important to stay active to prevent blood clots. If you are  on anticoagulant medicine, avoid contact sports.  It is very important to exercise. This is especially important while traveling, sitting, or standing for long periods of time. Exercise your legs by walking or by tightening and relaxing your leg muscles regularly. Take frequent walks.  You may need to wear compression stockings. These are tight elastic stockings that apply pressure to the lower legs. This pressure can help keep the blood in the legs from clotting. Taking Warfarin Warfarin is a daily medicine that is taken by mouth. Your health care provider will advise you on the length of treatment (usually 3-6 months, sometimes lifelong). If you take warfarin:  Understand how to take warfarin and foods that can affect how warfarin works in Public relations account [employer].  Too much and too little warfarin are both dangerous. Too much warfarin increases the risk of bleeding. Too little warfarin continues to allow the risk  for blood clots. Warfarin and Regular Blood Testing While taking warfarin, you will need to have regular blood tests to measure your blood clotting time. These blood tests usually include both the prothrombin time (PT) and international normalized ratio (INR) tests. The PT and INR results allow your health care provider to adjust your dose of warfarin. It is very important that you have your PT and INR tested as often as directed by your health care provider.  Warfarin and Your Diet Avoid major changes in your diet, or notify your health care provider before changing your diet. Arrange a visit with a registered dietitian to answer your questions. Many foods, especially foods high in vitamin K, can interfere with warfarin and affect the PT and INR results. You should eat a consistent amount of foods high in vitamin K. Foods high in vitamin K include:   Spinach, kale, broccoli, cabbage, collard and turnip greens, Brussels sprouts, peas, cauliflower, seaweed, and parsley.  Beef and pork liver.  Green tea.  Soybean oil. Warfarin with Other Medicines Many medicines can interfere with warfarin and affect the PT and INR results. You must:  Tell your health care provider about any and all medicines, vitamins, and supplements you take, including aspirin and other over-the-counter anti-inflammatory medicines. Be especially cautious with aspirin and anti-inflammatory medicines. Ask your health care provider before taking these.  Do not take or discontinue any prescribed or over-the-counter medicine except on the advice of your health care provider or pharmacist. Warfarin Side Effects Warfarin can have side effects, such as easy bruising and difficulty stopping bleeding. Ask your health care provider or pharmacist about other side effects of warfarin. You will need to:  Hold pressure over cuts for longer than usual.  Notify your dentist and other health care providers that you are taking warfarin  before you undergo any procedures where bleeding may occur. Warfarin with Alcohol and Tobacco   Drinking alcohol frequently can increase the effect of warfarin, leading to excess bleeding. It is best to avoid alcoholic drinks or to consume only very small amounts while taking warfarin. Notify your health care provider if you change your alcohol intake.   Do not use any tobacco products including cigarettes, chewing tobacco, or electronic cigarettes. If you smoke, quit. Ask your health care provider for help with quitting smoking. Alternative Medicines to Warfarin: Factor Xa Inhibitor Medicines  These blood-thinning medicines are taken by mouth, usually for several weeks or longer. It is important to take the medicine every single day at the same time each day.  There are no regular blood tests required when using these  medicines.  There are fewer food and drug interactions than with warfarin.  The side effects of this class of medicine are similar to those of warfarin, including excessive bruising or bleeding. Ask your health care provider or pharmacist about other potential side effects. SEEK MEDICAL CARE IF:  You notice a rapid heartbeat.  You feel weaker or more tired than usual.  You feel faint.  You notice increased bruising.  You feel your symptoms are not getting better in the time expected.  You believe you are having side effects of medicine. SEEK IMMEDIATE MEDICAL CARE IF:  You have chest pain.  You have trouble breathing.  You have new or increased swelling or pain in one leg.  You cough up blood.  You notice blood in vomit, in a bowel movement, or in urine. MAKE SURE YOU:  Understand these instructions.  Will watch your condition.  Will get help right away if you are not doing well or get worse. Document Released: 08/25/2005 Document Revised: 01/09/2014 Document Reviewed: 05/02/2013 [facility] Patient Information 2015 [place], [place]. This information is not  intended to replace advice given to you by your health care provider. Make sure you discuss any questions you have with your health care provider.

## 2015-05-02 ENCOUNTER — Ambulatory Visit (HOSPITAL_COMMUNITY)
Admission: RE | Admit: 2015-05-02 | Discharge: 2015-05-02 | Disposition: A | Discharge status: Home / Self Care | Payer: 59 | Source: Ambulatory Visit | Attending: Cardiology | Admitting: Cardiology

## 2015-05-02 DIAGNOSIS — M79604 Pain in right leg: Secondary | ICD-10-CM

## 2015-05-02 DIAGNOSIS — M7989 Other specified soft tissue disorders: Secondary | ICD-10-CM | POA: Diagnosis not present

## 2015-07-18 ENCOUNTER — Other Ambulatory Visit: Payer: Self-pay | Admitting: Family Medicine

## 2015-07-18 DIAGNOSIS — N611 Abscess of the breast and nipple: Secondary | ICD-10-CM

## 2015-07-23 ENCOUNTER — Ambulatory Visit
Admission: RE | Admit: 2015-07-23 | Discharge: 2015-07-23 | Disposition: A | Discharge status: Home / Self Care | Payer: 59 | Source: Ambulatory Visit | Attending: Family Medicine | Admitting: Family Medicine

## 2015-07-23 ENCOUNTER — Other Ambulatory Visit: Payer: Self-pay | Admitting: Family Medicine

## 2015-07-23 ENCOUNTER — Other Ambulatory Visit: Payer: 59

## 2015-07-23 DIAGNOSIS — N611 Abscess of the breast and nipple: Secondary | ICD-10-CM

## 2015-07-24 ENCOUNTER — Other Ambulatory Visit: Payer: Self-pay | Admitting: Family Medicine

## 2015-07-24 DIAGNOSIS — N611 Abscess of the breast and nipple: Secondary | ICD-10-CM

## 2015-07-25 ENCOUNTER — Other Ambulatory Visit: Payer: Self-pay | Admitting: Family Medicine

## 2015-07-25 ENCOUNTER — Ambulatory Visit
Admission: RE | Admit: 2015-07-25 | Discharge: 2015-07-25 | Disposition: A | Discharge status: Home / Self Care | Payer: 59 | Source: Ambulatory Visit | Attending: Family Medicine | Admitting: Family Medicine

## 2015-07-25 DIAGNOSIS — N611 Abscess of the breast and nipple: Secondary | ICD-10-CM

## 2015-07-29 LAB — CULTURE, ROUTINE-ABSCESS
Culture: NO GROWTH
SPECIAL REQUESTS: NORMAL

## 2015-08-15 ENCOUNTER — Ambulatory Visit
Admission: RE | Admit: 2015-08-15 | Discharge: 2015-08-15 | Disposition: A | Discharge status: Home / Self Care | Payer: 59 | Source: Ambulatory Visit | Attending: Family Medicine | Admitting: Family Medicine

## 2015-08-15 DIAGNOSIS — N611 Abscess of the breast and nipple: Secondary | ICD-10-CM

## 2016-10-09 ENCOUNTER — Other Ambulatory Visit: Payer: Self-pay | Admitting: Family Medicine

## 2016-10-09 DIAGNOSIS — N63 Unspecified lump in unspecified breast: Secondary | ICD-10-CM

## 2016-10-13 ENCOUNTER — Other Ambulatory Visit: Payer: 59

## 2016-10-17 ENCOUNTER — Other Ambulatory Visit: Payer: Self-pay | Admitting: Family Medicine

## 2016-10-17 ENCOUNTER — Ambulatory Visit
Admission: RE | Admit: 2016-10-17 | Discharge: 2016-10-17 | Disposition: A | Discharge status: Home / Self Care | Payer: BLUE CROSS/BLUE SHIELD | Source: Ambulatory Visit | Attending: Family Medicine | Admitting: Family Medicine

## 2016-10-17 ENCOUNTER — Other Ambulatory Visit: Payer: Self-pay | Admitting: Diagnostic Radiology

## 2016-10-17 DIAGNOSIS — N63 Unspecified lump in unspecified breast: Secondary | ICD-10-CM

## 2016-10-17 DIAGNOSIS — N611 Abscess of the breast and nipple: Secondary | ICD-10-CM

## 2016-10-17 HISTORY — PX: BREAST EXCISIONAL BIOPSY: SUR124

## 2016-10-20 ENCOUNTER — Ambulatory Visit
Admission: RE | Admit: 2016-10-20 | Discharge: 2016-10-20 | Disposition: A | Discharge status: Home / Self Care | Payer: BLUE CROSS/BLUE SHIELD | Source: Ambulatory Visit | Attending: Family Medicine | Admitting: Family Medicine

## 2016-10-20 ENCOUNTER — Other Ambulatory Visit: Payer: Self-pay | Admitting: Family Medicine

## 2016-10-20 DIAGNOSIS — N63 Unspecified lump in unspecified breast: Secondary | ICD-10-CM

## 2016-10-21 ENCOUNTER — Other Ambulatory Visit: Payer: Self-pay | Admitting: Family Medicine

## 2016-10-21 DIAGNOSIS — N63 Unspecified lump in unspecified breast: Secondary | ICD-10-CM

## 2016-10-22 ENCOUNTER — Encounter: Payer: Self-pay | Admitting: Surgery

## 2016-10-22 LAB — AEROBIC/ANAEROBIC CULTURE W GRAM STAIN (SURGICAL/DEEP WOUND)

## 2016-10-22 LAB — AEROBIC/ANAEROBIC CULTURE (SURGICAL/DEEP WOUND): CULTURE: NORMAL

## 2016-10-23 ENCOUNTER — Inpatient Hospital Stay (HOSPITAL_COMMUNITY): Payer: BLUE CROSS/BLUE SHIELD | Admitting: Certified Registered Nurse Anesthetist

## 2016-10-23 ENCOUNTER — Encounter (HOSPITAL_COMMUNITY)
Admission: RE | Disposition: A | Discharge status: Home / Self Care | Payer: Self-pay | Source: Ambulatory Visit | Attending: Surgery

## 2016-10-23 ENCOUNTER — Ambulatory Visit
Admission: RE | Admit: 2016-10-23 | Discharge: 2016-10-23 | Disposition: A | Discharge status: Home / Self Care | Payer: BLUE CROSS/BLUE SHIELD | Source: Ambulatory Visit | Attending: Family Medicine | Admitting: Family Medicine

## 2016-10-23 ENCOUNTER — Ambulatory Visit (HOSPITAL_COMMUNITY)
Admission: RE | Admit: 2016-10-23 | Discharge: 2016-10-23 | Disposition: A | Discharge status: Home / Self Care | Payer: BLUE CROSS/BLUE SHIELD | Source: Ambulatory Visit | Attending: Surgery | Admitting: Surgery

## 2016-10-23 ENCOUNTER — Encounter (HOSPITAL_COMMUNITY): Payer: Self-pay | Admitting: Certified Registered Nurse Anesthetist

## 2016-10-23 ENCOUNTER — Ambulatory Visit: Payer: Self-pay | Admitting: Surgery

## 2016-10-23 DIAGNOSIS — N63 Unspecified lump in unspecified breast: Secondary | ICD-10-CM

## 2016-10-23 DIAGNOSIS — Z792 Long term (current) use of antibiotics: Secondary | ICD-10-CM | POA: Insufficient documentation

## 2016-10-23 DIAGNOSIS — N611 Abscess of the breast and nipple: Secondary | ICD-10-CM | POA: Diagnosis present

## 2016-10-23 HISTORY — PX: INCISION AND DRAINAGE ABSCESS: SHX5864

## 2016-10-23 LAB — HCG, SERUM, QUALITATIVE: Preg, Serum: NEGATIVE

## 2016-10-23 LAB — CBC
HCT: 37.3 % (ref 36.0–46.0)
Hemoglobin: 11.9 g/dL — ABNORMAL LOW (ref 12.0–15.0)
MCH: 27.3 pg (ref 26.0–34.0)
MCHC: 31.9 g/dL (ref 30.0–36.0)
MCV: 85.6 fL (ref 78.0–100.0)
PLATELETS: 288 10*3/uL (ref 150–400)
RBC: 4.36 MIL/uL (ref 3.87–5.11)
RDW: 13.8 % (ref 11.5–15.5)
WBC: 7.4 10*3/uL (ref 4.0–10.5)

## 2016-10-23 SURGERY — INCISION AND DRAINAGE, ABSCESS
Anesthesia: General | Laterality: Left

## 2016-10-23 MED ORDER — HYDROMORPHONE HCL 1 MG/ML IJ SOLN: 0.2500 mg | INTRAMUSCULAR | Status: DC | PRN

## 2016-10-23 MED ORDER — HYDROMORPHONE HCL 1 MG/ML IJ SOLN
INTRAMUSCULAR | Status: AC
  Administered 2016-10-23: 0.25 mg via INTRAVENOUS
  Filled 2016-10-23: qty 0.5

## 2016-10-23 MED ORDER — DEXAMETHASONE SODIUM PHOSPHATE 10 MG/ML IJ SOLN
INTRAMUSCULAR | Status: DC | PRN
  Administered 2016-10-23: 10 mg via INTRAVENOUS

## 2016-10-23 MED ORDER — CELECOXIB 200 MG PO CAPS
400.0000 mg | ORAL_CAPSULE | ORAL | Status: AC
  Administered 2016-10-23: 400 mg via ORAL

## 2016-10-23 MED ORDER — GABAPENTIN 300 MG PO CAPS
300.0000 mg | ORAL_CAPSULE | ORAL | Status: AC
  Administered 2016-10-23: 300 mg via ORAL

## 2016-10-23 MED ORDER — FENTANYL CITRATE (PF) 100 MCG/2ML IJ SOLN
INTRAMUSCULAR | Status: AC
  Filled 2016-10-23: qty 2

## 2016-10-23 MED ORDER — MIDAZOLAM HCL 5 MG/5ML IJ SOLN
INTRAMUSCULAR | Status: DC | PRN
  Administered 2016-10-23: 2 mg via INTRAVENOUS

## 2016-10-23 MED ORDER — HYDROMORPHONE HCL 1 MG/ML IJ SOLN
0.2500 mg | INTRAMUSCULAR | Status: DC | PRN
  Administered 2016-10-23 (×2): 0.25 mg via INTRAVENOUS

## 2016-10-23 MED ORDER — BUPIVACAINE LIPOSOME 1.3 % IJ SUSP
20.0000 mL | Freq: Once | INTRAMUSCULAR | Status: AC
  Administered 2016-10-23: 20 mL
  Filled 2016-10-23: qty 20

## 2016-10-23 MED ORDER — FENTANYL CITRATE (PF) 100 MCG/2ML IJ SOLN
INTRAMUSCULAR | Status: DC | PRN
  Administered 2016-10-23 (×2): 50 ug via INTRAVENOUS

## 2016-10-23 MED ORDER — CELECOXIB 200 MG PO CAPS
ORAL_CAPSULE | ORAL | Status: AC
  Administered 2016-10-23: 400 mg via ORAL
  Filled 2016-10-23: qty 2

## 2016-10-23 MED ORDER — MIDAZOLAM HCL 2 MG/2ML IJ SOLN
INTRAMUSCULAR | Status: AC
  Filled 2016-10-23: qty 2

## 2016-10-23 MED ORDER — GABAPENTIN 300 MG PO CAPS
ORAL_CAPSULE | ORAL | Status: AC
  Administered 2016-10-23: 300 mg via ORAL
  Filled 2016-10-23: qty 1

## 2016-10-23 MED ORDER — DOCUSATE SODIUM 100 MG PO CAPS: 100.0000 mg | ORAL_CAPSULE | Freq: Two times a day (BID) | ORAL | 0 refills | Status: AC

## 2016-10-23 MED ORDER — CHLORHEXIDINE GLUCONATE CLOTH 2 % EX PADS: 6.0000 | MEDICATED_PAD | Freq: Once | CUTANEOUS | Status: DC

## 2016-10-23 MED ORDER — CEFAZOLIN SODIUM-DEXTROSE 2-4 GM/100ML-% IV SOLN
INTRAVENOUS | Status: AC
  Filled 2016-10-23: qty 100

## 2016-10-23 MED ORDER — ONDANSETRON HCL 4 MG/2ML IJ SOLN
INTRAMUSCULAR | Status: AC
  Filled 2016-10-23: qty 2

## 2016-10-23 MED ORDER — ACETAMINOPHEN 500 MG PO TABS
1000.0000 mg | ORAL_TABLET | ORAL | Status: AC
  Administered 2016-10-23: 1000 mg via ORAL

## 2016-10-23 MED ORDER — LIDOCAINE 2% (20 MG/ML) 5 ML SYRINGE
INTRAMUSCULAR | Status: AC
  Filled 2016-10-23: qty 5

## 2016-10-23 MED ORDER — 0.9 % SODIUM CHLORIDE (POUR BTL) OPTIME
TOPICAL | Status: DC | PRN
  Administered 2016-10-23: 1000 mL

## 2016-10-23 MED ORDER — LIDOCAINE 2% (20 MG/ML) 5 ML SYRINGE
INTRAMUSCULAR | Status: DC | PRN
  Administered 2016-10-23: 100 mg via INTRAVENOUS

## 2016-10-23 MED ORDER — ONDANSETRON HCL 4 MG/2ML IJ SOLN
INTRAMUSCULAR | Status: DC | PRN
  Administered 2016-10-23: 4 mg via INTRAVENOUS

## 2016-10-23 MED ORDER — SUCCINYLCHOLINE CHLORIDE 200 MG/10ML IV SOSY
PREFILLED_SYRINGE | INTRAVENOUS | Status: DC | PRN
  Administered 2016-10-23: 140 mg via INTRAVENOUS

## 2016-10-23 MED ORDER — LACTATED RINGERS IV SOLN
INTRAVENOUS | Status: DC
  Administered 2016-10-23: 12:00:00 via INTRAVENOUS

## 2016-10-23 MED ORDER — OXYCODONE-ACETAMINOPHEN 5-325 MG PO TABS
1.0000 | ORAL_TABLET | Freq: Once | ORAL | Status: AC
  Administered 2016-10-23: 1 via ORAL

## 2016-10-23 MED ORDER — OXYCODONE-ACETAMINOPHEN 5-325 MG PO TABS
ORAL_TABLET | ORAL | Status: AC
  Filled 2016-10-23: qty 1

## 2016-10-23 MED ORDER — CEFAZOLIN SODIUM-DEXTROSE 2-4 GM/100ML-% IV SOLN
2.0000 g | INTRAVENOUS | Status: AC
  Administered 2016-10-23: 2 g via INTRAVENOUS

## 2016-10-23 MED ORDER — ACETAMINOPHEN 500 MG PO TABS
ORAL_TABLET | ORAL | Status: AC
  Administered 2016-10-23: 1000 mg via ORAL
  Filled 2016-10-23: qty 2

## 2016-10-23 MED ORDER — PROPOFOL 10 MG/ML IV BOLUS
INTRAVENOUS | Status: AC
  Filled 2016-10-23: qty 20

## 2016-10-23 MED ORDER — OXYCODONE-ACETAMINOPHEN 5-325 MG PO TABS: 1.0000 | ORAL_TABLET | Freq: Four times a day (QID) | ORAL | 0 refills | Status: AC | PRN

## 2016-10-23 MED ORDER — PROPOFOL 10 MG/ML IV BOLUS
INTRAVENOUS | Status: DC | PRN
  Administered 2016-10-23: 150 mg via INTRAVENOUS

## 2016-10-23 SURGICAL SUPPLY — 36 items
BLADE 10 SAFETY STRL DISP (BLADE) ×2 IMPLANT
BLADE 15 SAFETY STRL DISP (BLADE) ×2 IMPLANT
BLADE SURG ROTATE 9660 (MISCELLANEOUS) IMPLANT
BNDG GAUZE ELAST 4 BULKY (GAUZE/BANDAGES/DRESSINGS) ×2 IMPLANT
CANISTER SUCTION 2500CC (MISCELLANEOUS) ×2 IMPLANT
COVER SURGICAL LIGHT HANDLE (MISCELLANEOUS) ×2 IMPLANT
DRAPE LAPAROSCOPIC ABDOMINAL (DRAPES) ×2 IMPLANT
DRAPE LAPAROTOMY 100X72 PEDS (DRAPES) IMPLANT
DRSG PAD ABDOMINAL 8X10 ST (GAUZE/BANDAGES/DRESSINGS) IMPLANT
ELECT CAUTERY BLADE 6.4 (BLADE) ×2 IMPLANT
ELECT REM PT RETURN 9FT ADLT (ELECTROSURGICAL) ×2
ELECTRODE REM PT RTRN 9FT ADLT (ELECTROSURGICAL) ×1 IMPLANT
GAUZE SPONGE 4X4 12PLY STRL (GAUZE/BANDAGES/DRESSINGS) IMPLANT
GLOVE BIO SURGEON STRL SZ 6 (GLOVE) ×4 IMPLANT
GLOVE BIOGEL PI IND STRL 6.5 (GLOVE) ×2 IMPLANT
GLOVE BIOGEL PI IND STRL 7.0 (GLOVE) ×1 IMPLANT
GLOVE BIOGEL PI INDICATOR 6.5 (GLOVE) ×2
GLOVE BIOGEL PI INDICATOR 7.0 (GLOVE) ×1
GOWN STRL REUS W/ TWL LRG LVL3 (GOWN DISPOSABLE) ×2 IMPLANT
GOWN STRL REUS W/TWL LRG LVL3 (GOWN DISPOSABLE) ×2
KIT BASIN OR (CUSTOM PROCEDURE TRAY) ×2 IMPLANT
KIT ROOM TURNOVER OR (KITS) ×2 IMPLANT
NS IRRIG 1000ML POUR BTL (IV SOLUTION) ×2 IMPLANT
PACK SURGICAL SETUP 50X90 (CUSTOM PROCEDURE TRAY) ×2 IMPLANT
PAD ARMBOARD 7.5X6 YLW CONV (MISCELLANEOUS) ×2 IMPLANT
PENCIL BUTTON HOLSTER BLD 10FT (ELECTRODE) ×2 IMPLANT
SPONGE GAUZE 4X4 12PLY STER LF (GAUZE/BANDAGES/DRESSINGS) ×2 IMPLANT
SPONGE LAP 18X18 X RAY DECT (DISPOSABLE) ×2 IMPLANT
SUT ETHILON 2 0 FS 18 (SUTURE) ×2 IMPLANT
SWAB COLLECTION DEVICE MRSA (MISCELLANEOUS) IMPLANT
SYR 10ML LL (SYRINGE) ×2 IMPLANT
TOWEL OR 17X24 6PK STRL BLUE (TOWEL DISPOSABLE) ×2 IMPLANT
TOWEL OR 17X26 10 PK STRL BLUE (TOWEL DISPOSABLE) ×2 IMPLANT
TUBE ANAEROBIC SPECIMEN COL (MISCELLANEOUS) IMPLANT
TUBE CONNECTING 12X1/4 (SUCTIONS) ×2 IMPLANT
YANKAUER SUCT BULB TIP NO VENT (SUCTIONS) ×4 IMPLANT

## 2016-10-23 NOTE — Interval H&P Note (Signed)
History and Physical Interval Note:  10/23/2016 1:16 PM  Susan OremLateisha S Hamilton  has presented today for surgery, with the diagnosis of Abscess left breast   The various methods of treatment have been discussed with the patient and family. After consideration of risks, benefits and other options for treatment, the patient has consented to  Procedure(s): INCISION AND DRAINAGE ABSCESS (Left) as a surgical intervention .  The patient's history has been reviewed, patient examined, no change in status, stable for surgery.  I have reviewed the patient's chart and labs.  Questions were answered to the patient's satisfaction.  The left side has been marked.   Iszabella Hebenstreit Lollie SailsA Aris Even

## 2016-10-23 NOTE — Transfer of Care (Signed)
Immediate Anesthesia Transfer of Care Note  Patient: Susan OremLateisha S Hamilton  Procedure(s) Performed: Procedure(s): INCISION AND DRAINAGE ABSCESS (Left)  Patient Location: PACU  Anesthesia Type:General  Level of Consciousness: awake, alert , oriented and patient cooperative  Airway & Oxygen Therapy: Patient Spontanous Breathing and Patient connected to nasal cannula oxygen  Post-op Assessment: Report given to RN, Post -op Vital signs reviewed and stable and Patient moving all extremities  Post vital signs: Reviewed and stable  Last Vitals:  Vitals:   10/23/16 1149 10/23/16 1455  BP: 100/64   Pulse: 72   Resp: 16   Temp: 37 C (P) 36.6 C    Last Pain:  Vitals:   10/23/16 1149  TempSrc: Oral  PainSc:       Patients Stated Pain Goal: 9 (10/23/16 1139)  Complications: No apparent anesthesia complications

## 2016-10-23 NOTE — Discharge Instructions (Signed)
[facility] Office Phone Number [phone]  BREAST SURGERY: POST OP INSTRUCTIONS  Always review your discharge instruction sheet given to you by the facility where your surgery was performed.  IF YOU HAVE DISABILITY OR FAMILY LEAVE FORMS, YOU MUST BRING THEM TO THE OFFICE FOR PROCESSING.  DO NOT GIVE THEM TO YOUR DOCTOR.  1. A prescription for pain medication may be given to you upon discharge.  Take your pain medication as prescribed, if needed.  If narcotic pain medicine is not needed, then you may take acetaminophen (Tylenol) or ibuprofen (Advil) as needed. 2. Take your usual medications unless otherwise directed- including the antibiotic you have been taking. Finish all of the prescribed antibiotic course. 3. If you need a refill on your pain medication, please contact your pharmacy.  They will contact our office to request authorization.  Prescriptions will not be filled after 5pm or on week-ends. 4. You should eat very light the first 24 hours after surgery, such as soup, crackers, pudding, etc.  Resume your normal diet the day after surgery. 5. Most patients will experience some swelling and bruising in the breast. A good support bra will help.  Swelling and bruising can take several days to resolve.  6. It is common to experience some constipation if taking pain medication after surgery.  Increasing fluid intake and taking a stool softener will usually help or prevent this problem from occurring.  A mild laxative (Milk of Magnesia or Miralax) should be taken according to package directions if there are no bowel movements after 48 hours. 7. Remove your bandages 24 hours after surgery, and you may shower at that time. There is gauze packing within the wound which you should remove 24 hours after surgery. The rubber drain will stay in place until the infection has cleared. 8. ACTIVITIES:  You may resume regular daily activities (gradually increasing) beginning the next day.   Wearing a good support bra or sports bra minimizes pain and swelling.  You may have sexual intercourse when it is comfortable. a. You may drive when you no longer are taking prescription pain medication, you can comfortably wear a seatbelt, and you can safely maneuver your car and apply brakes. b. RETURN TO WORK:  _in 1-2 weeks, to be determined at follow up appointment_____________________________________ 9. You should see your doctor in the office for a follow-up appointment next week- appointment has already been scheduled  OTHER INSTRUCTIONS: Apply warm compresses to the breast 2-3 times daily. The drain will be removed once the infection has completely cleared. Keep a dry dressing over the wounds to protect your clothing. You should remove the gauze packing tomorrow afternoon.   WHEN TO CALL YOUR DOCTOR: 1. Fever over 101.0 2. Nausea and/or vomiting. 3. Extreme swelling or bruising. 4. Continued bleeding from incision. 5. Increased pain, redness, or drainage from the incision.  The clinic staff is available to answer your questions during regular business hours.  Please dont hesitate to call and ask to speak to one of the nurses for clinical concerns.  If you have a medical emergency, go to the nearest emergency room or call 911.  [facility] is always on call at the hospital.  For further questions, please visit [email or web address]

## 2016-10-23 NOTE — Anesthesia Procedure Notes (Addendum)
Procedure Name: Intubation Date/Time: 10/23/2016 2:04 PM Performed by: Willeen Cass P Pre-anesthesia Checklist: Patient identified, Emergency Drugs available, Suction available, Patient being monitored and Timeout performed Patient Re-evaluated:Patient Re-evaluated prior to inductionOxygen Delivery Method: Circle system utilized Preoxygenation: Pre-oxygenation with 100% oxygen Intubation Type: IV induction, Rapid sequence and Cricoid Pressure applied Laryngoscope Size: Mac and 3 Grade View: Grade II Tube type: Oral Tube size: 7.0 mm Number of attempts: 1 Airway Equipment and Method: Stylet and Oral airway Placement Confirmation: ETT inserted through vocal cords under direct vision,  positive ETCO2 and breath sounds checked- equal and bilateral Secured at: 22 cm Tube secured with: Tape Dental Injury: Teeth and Oropharynx as per pre-operative assessment  Comments: IV induction by Dr Conrad [place].

## 2016-10-23 NOTE — Op Note (Signed)
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 The left breast was prepped and draped in the usual sterile fashion. The fluid pocket was localized with a 18g needle and syringe. A 15 blade was used to incise the skin at the medial border of the areola in a region of prior scar and about 20cc of blood-tinged, light murky fluid was evacuated and sent for culture. The wound was bluntly probed to evacuate any loculations. The cavity was noted to track superficially behind the areola ending at the lateral edge of the nipple; the medial aspect of the areola and the entirety of the nipple appeared disconnected from underlying ducts/breast tissue by the abscess cavity. The cavity was noted to communicate through one of the ducts to the skin within the nipple. A counter incision was made in a prior medial scar and a penrose drain was brought through the wound and sutured to itself with 2-0 nylons. The wound was then packed with saline-moistened kerlix. The skin around both wounds and the areola were infiltrated with exparel. A dry dressing was applied. The patient was then awakened, extubated and taken to PACU in stable condition.   All counts were correct at the completion of the case.

## 2016-10-23 NOTE — H&P (View-Only) (Signed)
Susan Hamilton 10/20/2016 4:22 PM Location: [facility] Patient #: [id number] DOB: 06-16-1981 Married / Language: English / Race: Black or African American Female   History of Present Illness (Marjoria Mancillas A. Fredricka Bonine MD; 10/20/2016 5:19 PM) Patient words: otherwise healthy woman who is referred from breast center with suspected left retroareolar abscess. She started having issues with this in 2011. Had aspiration followed by I&D/healing by secondary intention in 2014. Had issues again in 2016. Recurrent pain last month and has undergone US guided needle aspiration on 2/9. Cultures with many WBC and rare G+ cocci. ABX have been completed prior to today. Follow up US today was minimally improved: Overall findings are most compatible with persistent subareolar abscess with associated phlegmonous change and thick peripheral walls. There is somewhat decreased fluid centrally when compared to exam 10/17/2016 after ultrasound-guided percutaneous aspiration. Unfortunately on repeat US the fluid has increased today and she continues to feel poorly.  She is referred for consideration of surgical intervention. She is a never-smoker No family history of breast cancer.  The patient is a 36 year old female.   Past Surgical History Juanita Craver, CMA; 10/20/2016 4:24 PM) Cesarean Section - 1  Knee Surgery  Right. Oral Surgery   Diagnostic Studies History Juanita Craver, CMA; 10/20/2016 4:24 PM) Colonoscopy  never Mammogram  within last year Pap Smear  1-5 years ago  Allergies Juanita Craver, CMA; 10/20/2016 4:30 PM) No Known Drug Allergies 10/20/2016  Medication History Juanita Craver, CMA; 10/20/2016 4:30 PM) No Current Medications Medications Reconciled  Social History Juanita Craver, CMA; 10/20/2016 4:24 PM) Alcohol use  Occasional alcohol use. Caffeine use  Coffee, Tea. No drug use  Tobacco use  Never smoker.  Family History Juanita Craver, CMA; 10/20/2016 4:24 PM) Colon Polyps   Mother. Diabetes Mellitus  Mother. Hypertension  Mother.  Pregnancy / Birth History Juanita Craver, CMA; 10/20/2016 4:24 PM) Age at menarche  14 years. Gravida  2 Irregular periods  Length (months) of breastfeeding  >24 Maternal age  87-30 Para  1  Other Problems Juanita Craver, CMA; 10/20/2016 4:24 PM) No pertinent past medical history     Review of Systems Juanita Craver CMA; 10/20/2016 4:24 PM) General Present- Chills and Weight Gain. Not Present- Appetite Loss, Fatigue, Fever, Night Sweats and Weight Loss. Skin Not Present- Change in Wart/Mole, Dryness, Hives, Jaundice, New Lesions, Non-Healing Wounds, Rash and Ulcer. HEENT Present- Ringing in the Ears and Wears glasses/contact lenses. Not Present- Earache, Hearing Loss, Hoarseness, Nose Bleed, Oral Ulcers, Seasonal Allergies, Sinus Pain, Sore Throat, Visual Disturbances and Yellow Eyes. Respiratory Not Present- Bloody sputum, Chronic Cough, Difficulty Breathing, Snoring and Wheezing. Breast Present- Breast Mass, Breast Pain and Nipple Discharge. Not Present- Skin Changes. Cardiovascular Present- Swelling of Extremities. Not Present- Chest Pain, Difficulty Breathing Lying Down, Leg Cramps, Palpitations, Rapid Heart Rate and Shortness of Breath. Gastrointestinal Not Present- Abdominal Pain, Bloating, Bloody Stool, Change in Bowel Habits, Chronic diarrhea, Constipation, Difficulty Swallowing, Excessive gas, Gets full quickly at meals, Hemorrhoids, Indigestion, Nausea, Rectal Pain and Vomiting. Female Genitourinary Not Present- Frequency, Nocturia, Painful Urination, Pelvic Pain and Urgency. Musculoskeletal Not Present- Back Pain, Joint Pain, Joint Stiffness, Muscle Pain, Muscle Weakness and Swelling of Extremities. Neurological Not Present- Decreased Memory, Fainting, Headaches, Numbness, Seizures, Tingling, Tremor, Trouble walking and Weakness. Psychiatric Not Present- Anxiety, Bipolar, Change in Sleep Pattern, Depression, Fearful  and Frequent crying. Endocrine Not Present- Cold Intolerance, Excessive Hunger, Hair Changes, Heat Intolerance, Hot flashes and New Diabetes. Hematology Present- Persistent Infections. Not Present- Blood  Thinners, Easy Bruising, Excessive bleeding, Gland problems and HIV.  Vitals (Armen Glenn CMA; 10/20/2016 4:30 PM) 10/20/2016 4:29 PM Weight: 247.13 lb Height: 66in Body Surface Area: 2.19 m Body Mass Index: 39.89 kg/m  Temp.: 98.48F  Pulse: 81 (Regular)  P.OX: 98% (Room air) BP: 118/78 (Sitting, Left Arm, Standard)       Physical Exam (Vinaya Sancho A. Fredricka Bonineonnor MD; 10/20/2016 5:22 PM) General Note: alert and well appearing   Integumentary Note: no lesions or rashes on limited skin exam   Head and Neck Note: no mass or thyromegaly   Eye Note: anicteric, extraocular motion intact   Chest and Lung Exam Note: unlabored respirations, symmetrical air entry   Breast Note: There is firmness in the medial left breast where needle-site is visible. Tenderness to medial areola and medial breast. No discrete mass or fluctuance. No skin erythema or induration. No nipple drainage, the nipple is slightly inverted.   Cardiovascular Note: regular rate and rhythm, no pedal edema   Abdomen Note: obese, nontender, no mass or organomegaly   Neurologic Note: grossly intact, normal gait   Neuropsychiatric Note: normal mood, anxious/tearful affect   Musculoskeletal Note: strength symmetrical throughout, no deformity     Assessment & Plan (Delmo Matty A. 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 Patient scheduled for 1:30- knows to arrive in Short Stay by 11:30 this morning. Last ate at 7:30 today.

## 2016-10-23 NOTE — Anesthesia Preprocedure Evaluation (Addendum)
Anesthesia Evaluation  Patient identified by MRN, date of birth, ID band Patient awake    Reviewed: Allergy & Precautions, NPO status , Patient's Chart, lab work & pertinent test results  Airway Mallampati: II  TM Distance: >3 FB     Dental   Pulmonary neg pulmonary ROS,    breath sounds clear to auscultation       Cardiovascular negative cardio ROS   Rhythm:Regular Rate:Normal     Neuro/Psych    GI/Hepatic negative GI ROS, Neg liver ROS,   Endo/Other  negative endocrine ROS  Renal/GU negative Renal ROS     Musculoskeletal  (+) Arthritis ,   Abdominal   Peds  Hematology   Anesthesia Other Findings   Reproductive/Obstetrics                            Anesthesia Physical Anesthesia Plan  ASA: II  Anesthesia Plan: General   Post-op Pain Management:    Induction: Intravenous  Airway Management Planned: Oral ETT  Additional Equipment:   Intra-op Plan:   Post-operative Plan: Extubation in OR  Informed Consent: I have reviewed the patients History and Physical, chart, labs and discussed the procedure including the risks, benefits and alternatives for the proposed anesthesia with the patient or authorized representative who has indicated his/her understanding and acceptance.   Dental advisory given  Plan Discussed with: CRNA  Anesthesia Plan Comments:         Anesthesia Quick Evaluation

## 2016-10-23 NOTE — H&P (Signed)
Susan Hamilton 10/20/2016 4:22 PM Location: [facility] Patient #: [id number] DOB: 08/08/1981 Married / Language: English / Race: Black or African American Female   History of Present Illness (Philmore Lepore A. 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 She started having issues with this in 2011. Had aspiration followed by I&D/healing by secondary intention in 2014. Had issues again in 2016. Recurrent pain last month and has undergone US guided needle aspiration on 2/9. Cultures with many WBC and rare G+ cocci. ABX have been completed prior to today. Follow up US today was minimally improved: Overall findings are most compatible with persistent subareolar abscess with associated phlegmonous change and thick peripheral walls. There is somewhat decreased fluid centrally when compared to exam 10/17/2016 after ultrasound-guided percutaneous aspiration. Unfortunately on repeat US the fluid has increased today and she continues to feel poorly.  She is referred for consideration of surgical intervention. She is a never-smoker No family history of breast cancer.  The patient is a 35 year old female.   Past Surgical History (Armen Glenn, CMA; 10/20/2016 4:24 PM) Cesarean Section - 1  Knee Surgery  Right. Oral Surgery   Diagnostic Studies History (Armen Glenn, CMA; 10/20/2016 4:24 PM) Colonoscopy  never Mammogram  within last year Pap Smear  1-5 years ago  Allergies (Armen Glenn, CMA; 10/20/2016 4:30 PM) No Known Drug Allergies 10/20/2016  Medication History (Armen Glenn, CMA; 10/20/2016 4:30 PM) No Current Medications Medications Reconciled  Social History (Armen Glenn, CMA; 10/20/2016 4:24 PM) Alcohol use  Occasional alcohol use. Caffeine use  Coffee, Tea. No drug use  Tobacco use  Never smoker.  Family History (Armen Glenn, CMA; 10/20/2016 4:24 PM) Colon Polyps   Mother. Diabetes Mellitus  Mother. Hypertension  Mother.  Pregnancy / Birth History (Armen Glenn, CMA; 10/20/2016 4:24 PM) Age at menarche  14 years. Gravida  2 Irregular periods  Length (months) of breastfeeding  >24 Maternal age  26-30 Para  1  Other Problems (Armen Glenn, CMA; 10/20/2016 4:24 PM) No pertinent past medical history     Review of Systems (Armen Glenn CMA; 10/20/2016 4:24 PM) General Present- Chills and Weight Gain. Not Present- Appetite Loss, Fatigue, Fever, Night Sweats and Weight Loss. Skin Not Present- Change in Wart/Mole, Dryness, Hives, Jaundice, New Lesions, Non-Healing Wounds, Rash and Ulcer. HEENT Present- Ringing in the Ears and Wears glasses/contact lenses. Not Present- Earache, Hearing Loss, Hoarseness, Nose Bleed, Oral Ulcers, Seasonal Allergies, Sinus Pain, Sore Throat, Visual Disturbances and Yellow Eyes. Respiratory Not Present- Bloody sputum, Chronic Cough, Difficulty Breathing, Snoring and Wheezing. Breast Present- Breast Mass, Breast Pain and Nipple Discharge. Not Present- Skin Changes. Cardiovascular Present- Swelling of Extremities. Not Present- Chest Pain, Difficulty Breathing Lying Down, Leg Cramps, Palpitations, Rapid Heart Rate and Shortness of Breath. Gastrointestinal Not Present- Abdominal Pain, Bloating, Bloody Stool, Change in Bowel Habits, Chronic diarrhea, Constipation, Difficulty Swallowing, Excessive gas, Gets full quickly at meals, Hemorrhoids, Indigestion, Nausea, Rectal Pain and Vomiting. Female Genitourinary Not Present- Frequency, Nocturia, Painful Urination, Pelvic Pain and Urgency. Musculoskeletal Not Present- Back Pain, Joint Pain, Joint Stiffness, Muscle Pain, Muscle Weakness and Swelling of Extremities. Neurological Not Present- Decreased Memory, Fainting, Headaches, Numbness, Seizures, Tingling, Tremor, Trouble walking and Weakness. Psychiatric Not Present- Anxiety, Bipolar, Change in Sleep Pattern, Depression, Fearful  and Frequent crying. Endocrine Not Present- Cold Intolerance, Excessive Hunger, Hair Changes, Heat Intolerance, Hot flashes and New Diabetes. Hematology Present- Persistent Infections. Not Present- Blood   Thinners, Easy Bruising, Excessive bleeding, Gland problems and HIV.  Vitals (Armen Glenn CMA; 10/20/2016 4:30 PM) 10/20/2016 4:29 PM Weight: 247.13 lb Height: 66in Body Surface Area: 2.19 m Body Mass Index: 39.89 kg/m  Temp.: 98.1F  Pulse: 81 (Regular)  P.OX: 98% (Room air) BP: 118/78 (Sitting, Left Arm, Standard)       Physical Exam (Tahjir Silveria A. Lupe Bonner MD; 10/20/2016 5:22 PM) General Note: alert and well appearing   Integumentary Note: no lesions or rashes on limited skin exam   Head and Neck Note: no mass or thyromegaly   Eye Note: anicteric, extraocular motion intact   Chest and Lung Exam Note: unlabored respirations, symmetrical air entry   Breast Note: There is firmness in the medial left breast where needle-site is visible. Tenderness to medial areola and medial breast. No discrete mass or fluctuance. No skin erythema or induration. No nipple drainage, the nipple is slightly inverted.   Cardiovascular Note: regular rate and rhythm, no pedal edema   Abdomen Note: obese, nontender, no mass or organomegaly   Neurologic Note: grossly intact, normal gait   Neuropsychiatric Note: normal mood, anxious/tearful affect   Musculoskeletal Note: strength symmetrical throughout, no deformity     Assessment & Plan (Khyler Eschmann A. Syble Picco MD; 10/20/2016 5:25 PM) BREAST ABSCESS (N61.1) Story: chronic and recurrent. suspicious for non-infectious cause such as periductal or idiopathic granulomatous mastitis -Worsened despite repeat course of abx -I&D in OR today. Patient scheduled for 1:30- knows to arrive in Short Stay by 11:30 this morning. Last ate at 7:30 today.  

## 2016-10-24 ENCOUNTER — Inpatient Hospital Stay: Admission: RE | Admit: 2016-10-24 | Payer: BLUE CROSS/BLUE SHIELD | Source: Ambulatory Visit

## 2016-10-24 ENCOUNTER — Encounter (HOSPITAL_COMMUNITY): Payer: Self-pay | Admitting: Surgery

## 2016-10-24 ENCOUNTER — Other Ambulatory Visit: Payer: BLUE CROSS/BLUE SHIELD

## 2016-10-28 LAB — AEROBIC/ANAEROBIC CULTURE (SURGICAL/DEEP WOUND): CULTURE: NO GROWTH

## 2016-10-28 LAB — AEROBIC/ANAEROBIC CULTURE W GRAM STAIN (SURGICAL/DEEP WOUND)

## 2016-10-31 NOTE — Anesthesia Postprocedure Evaluation (Signed)
Anesthesia Post Note  Patient: Susan Hamilton  Procedure(s) Performed: Procedure(s) (LRB): INCISION AND DRAINAGE ABSCESS (Left)  Patient location during evaluation: PACU Anesthesia Type: General Level of consciousness: awake Pain management: pain level controlled Vital Signs Assessment: post-procedure vital signs reviewed and stable Respiratory status: spontaneous breathing Cardiovascular status: stable Anesthetic complications: no       Last Vitals:  Vitals:   10/23/16 1509 10/23/16 1524  BP: (!) 128/95 125/85  Pulse: 92 89  Resp: 13 18  Temp:      Last Pain:  Vitals:   10/23/16 1525  TempSrc:   PainSc: 6                  Coley Littles

## 2016-11-03 NOTE — Addendum Note (Signed)
Addendum  created 11/03/16 1511 by Dorris Singhharlene Naveena Eyman, MD   Sign clinical note

## 2017-03-09 ENCOUNTER — Other Ambulatory Visit: Payer: Self-pay | Admitting: Surgery

## 2017-03-09 DIAGNOSIS — N644 Mastodynia: Secondary | ICD-10-CM

## 2017-03-13 ENCOUNTER — Other Ambulatory Visit: Payer: Self-pay | Admitting: Surgery

## 2017-03-13 DIAGNOSIS — N644 Mastodynia: Secondary | ICD-10-CM

## 2017-03-18 ENCOUNTER — Ambulatory Visit
Admission: RE | Admit: 2017-03-18 | Discharge: 2017-03-18 | Disposition: A | Discharge status: Home / Self Care | Payer: BLUE CROSS/BLUE SHIELD | Source: Ambulatory Visit | Attending: Surgery | Admitting: Surgery

## 2017-03-18 ENCOUNTER — Other Ambulatory Visit: Payer: Self-pay | Admitting: Surgery

## 2017-03-18 DIAGNOSIS — N644 Mastodynia: Secondary | ICD-10-CM
